# Patient Record
Sex: Male | Born: 1960 | Race: White | Hispanic: No | Marital: Single | Smoking: Current every day smoker
Health system: Southern US, Community
[De-identification: ages and names within clinical notes are randomized; demographics above are authoritative.]

## PROBLEM LIST (undated history)

## (undated) DIAGNOSIS — I10 Essential (primary) hypertension: Secondary | ICD-10-CM

## (undated) DIAGNOSIS — E785 Hyperlipidemia, unspecified: Secondary | ICD-10-CM

## (undated) DIAGNOSIS — M1990 Unspecified osteoarthritis, unspecified site: Secondary | ICD-10-CM

## (undated) DIAGNOSIS — I82409 Acute embolism and thrombosis of unspecified deep veins of unspecified lower extremity: Secondary | ICD-10-CM

## (undated) DIAGNOSIS — L409 Psoriasis, unspecified: Secondary | ICD-10-CM

## (undated) HISTORY — DX: Psoriasis, unspecified: L40.9

## (undated) HISTORY — DX: Essential (primary) hypertension: I10

## (undated) HISTORY — PX: TONSILLECTOMY: SUR1361

## (undated) HISTORY — DX: Acute embolism and thrombosis of unspecified deep veins of unspecified lower extremity: I82.409

## (undated) HISTORY — DX: Unspecified osteoarthritis, unspecified site: M19.90

## (undated) HISTORY — PX: OTHER SURGICAL HISTORY: SHX169

## (undated) HISTORY — DX: Hyperlipidemia, unspecified: E78.5

---

## 2004-07-01 ENCOUNTER — Ambulatory Visit: Payer: Self-pay | Admitting: Internal Medicine

## 2004-08-15 ENCOUNTER — Ambulatory Visit: Payer: Self-pay | Admitting: Internal Medicine

## 2004-08-16 ENCOUNTER — Ambulatory Visit: Payer: Self-pay | Admitting: Internal Medicine

## 2004-08-24 ENCOUNTER — Encounter: Admission: RE | Admit: 2004-08-24 | Discharge: 2004-08-24 | Payer: Self-pay | Admitting: Internal Medicine

## 2004-08-24 ENCOUNTER — Ambulatory Visit: Payer: Self-pay

## 2004-09-06 ENCOUNTER — Ambulatory Visit: Payer: Self-pay | Admitting: Internal Medicine

## 2004-12-26 ENCOUNTER — Ambulatory Visit: Payer: Self-pay | Admitting: Internal Medicine

## 2005-01-19 ENCOUNTER — Ambulatory Visit: Payer: Self-pay | Admitting: Internal Medicine

## 2005-04-04 ENCOUNTER — Ambulatory Visit: Payer: Self-pay | Admitting: Internal Medicine

## 2005-04-11 ENCOUNTER — Ambulatory Visit: Payer: Self-pay | Admitting: Internal Medicine

## 2005-05-29 ENCOUNTER — Ambulatory Visit: Payer: Self-pay | Admitting: Internal Medicine

## 2005-10-25 ENCOUNTER — Ambulatory Visit: Payer: Self-pay | Admitting: Internal Medicine

## 2005-10-30 ENCOUNTER — Ambulatory Visit: Payer: Self-pay | Admitting: Internal Medicine

## 2006-01-08 ENCOUNTER — Ambulatory Visit: Payer: Self-pay | Admitting: Internal Medicine

## 2006-08-01 ENCOUNTER — Ambulatory Visit: Payer: Self-pay | Admitting: Internal Medicine

## 2006-08-01 LAB — CONVERTED CEMR LAB
AST: 19 units/L (ref 0–37)
Creatinine, Ser: 0.7 mg/dL (ref 0.4–1.5)
Direct LDL: 107.2 mg/dL
HDL: 42.5 mg/dL (ref >39.0)
Hgb A1c MFr Bld: 6.6 % — ABNORMAL HIGH (ref 4.6–6.0)
Microalb, Ur: 4.9 mg/dL — ABNORMAL HIGH (ref 0.0–1.9)
Potassium: 4.2 meq/L (ref 3.5–5.1)

## 2006-08-09 ENCOUNTER — Ambulatory Visit: Payer: Self-pay | Admitting: Internal Medicine

## 2006-11-15 ENCOUNTER — Encounter: Payer: Self-pay | Admitting: Internal Medicine

## 2006-11-28 ENCOUNTER — Ambulatory Visit: Payer: Self-pay | Admitting: Internal Medicine

## 2006-11-28 DIAGNOSIS — T7841XA Arthus phenomenon, initial encounter: Secondary | ICD-10-CM

## 2006-11-29 LAB — CONVERTED CEMR LAB
ALT: 20 U/L
AST: 18 U/L
Cholesterol: 226 mg/dL
Creatinine,U: 153.9 mg/dL
Direct LDL: 115.4 mg/dL
HDL: 40 mg/dL
Hgb A1c MFr Bld: 6.6 % — ABNORMAL HIGH
Microalb Creat Ratio: 59.1 mg/g — ABNORMAL HIGH
Microalb, Ur: 9.1 mg/dL — ABNORMAL HIGH
Total CHOL/HDL Ratio: 5.7
Triglycerides: 410 mg/dL
VLDL: 82 mg/dL — ABNORMAL HIGH

## 2006-12-07 ENCOUNTER — Ambulatory Visit: Payer: Self-pay | Admitting: Internal Medicine

## 2006-12-07 DIAGNOSIS — E119 Type 2 diabetes mellitus without complications: Secondary | ICD-10-CM

## 2006-12-07 DIAGNOSIS — E785 Hyperlipidemia, unspecified: Secondary | ICD-10-CM | POA: Insufficient documentation

## 2006-12-07 DIAGNOSIS — L408 Other psoriasis: Secondary | ICD-10-CM

## 2006-12-07 DIAGNOSIS — I10 Essential (primary) hypertension: Secondary | ICD-10-CM | POA: Insufficient documentation

## 2007-01-04 ENCOUNTER — Encounter: Payer: Self-pay | Admitting: Internal Medicine

## 2007-01-18 ENCOUNTER — Encounter: Payer: Self-pay | Admitting: Internal Medicine

## 2007-02-26 ENCOUNTER — Ambulatory Visit: Payer: Self-pay | Admitting: Internal Medicine

## 2007-02-26 ENCOUNTER — Telehealth (INDEPENDENT_AMBULATORY_CARE_PROVIDER_SITE_OTHER): Payer: Self-pay | Admitting: *Deleted

## 2007-02-27 ENCOUNTER — Encounter (INDEPENDENT_AMBULATORY_CARE_PROVIDER_SITE_OTHER): Payer: Self-pay | Admitting: *Deleted

## 2007-03-01 ENCOUNTER — Encounter (INDEPENDENT_AMBULATORY_CARE_PROVIDER_SITE_OTHER): Payer: Self-pay | Admitting: *Deleted

## 2007-03-01 LAB — CONVERTED CEMR LAB
AST: 20 units/L (ref 0–37)
BUN: 14 mg/dL (ref 6–23)
Creatinine, Ser: 0.8 mg/dL (ref 0.4–1.5)
Triglycerides: 295 mg/dL (ref 0–149)

## 2007-08-08 ENCOUNTER — Telehealth (INDEPENDENT_AMBULATORY_CARE_PROVIDER_SITE_OTHER): Payer: Self-pay | Admitting: *Deleted

## 2007-08-09 ENCOUNTER — Telehealth (INDEPENDENT_AMBULATORY_CARE_PROVIDER_SITE_OTHER): Payer: Self-pay | Admitting: *Deleted

## 2007-08-12 ENCOUNTER — Encounter: Payer: Self-pay | Admitting: Internal Medicine

## 2007-08-12 ENCOUNTER — Encounter
Admission: RE | Admit: 2007-08-12 | Discharge: 2007-08-12 | Payer: Self-pay | Admitting: Physical Medicine and Rehabilitation

## 2007-08-19 ENCOUNTER — Encounter: Payer: Self-pay | Admitting: Internal Medicine

## 2007-08-29 ENCOUNTER — Ambulatory Visit (HOSPITAL_COMMUNITY): Admission: RE | Admit: 2007-08-29 | Discharge: 2007-08-31 | Payer: Self-pay | Admitting: Neurological Surgery

## 2007-08-30 ENCOUNTER — Encounter (INDEPENDENT_AMBULATORY_CARE_PROVIDER_SITE_OTHER): Payer: Self-pay | Admitting: Neurological Surgery

## 2007-08-30 ENCOUNTER — Ambulatory Visit: Payer: Self-pay | Admitting: Vascular Surgery

## 2007-09-05 ENCOUNTER — Ambulatory Visit: Payer: Self-pay | Admitting: Cardiology

## 2007-09-12 ENCOUNTER — Ambulatory Visit: Payer: Self-pay | Admitting: Internal Medicine

## 2007-09-12 DIAGNOSIS — Z86718 Personal history of other venous thrombosis and embolism: Secondary | ICD-10-CM | POA: Insufficient documentation

## 2007-09-17 ENCOUNTER — Ambulatory Visit: Payer: Self-pay | Admitting: Cardiology

## 2007-09-26 ENCOUNTER — Ambulatory Visit: Payer: Self-pay | Admitting: Internal Medicine

## 2007-10-10 ENCOUNTER — Ambulatory Visit: Payer: Self-pay | Admitting: Cardiology

## 2007-10-15 ENCOUNTER — Telehealth (INDEPENDENT_AMBULATORY_CARE_PROVIDER_SITE_OTHER): Payer: Self-pay | Admitting: *Deleted

## 2007-10-24 ENCOUNTER — Ambulatory Visit: Payer: Self-pay | Admitting: Internal Medicine

## 2007-11-11 ENCOUNTER — Ambulatory Visit: Payer: Self-pay | Admitting: Cardiology

## 2007-11-22 ENCOUNTER — Telehealth (INDEPENDENT_AMBULATORY_CARE_PROVIDER_SITE_OTHER): Payer: Self-pay | Admitting: *Deleted

## 2007-12-02 ENCOUNTER — Ambulatory Visit: Payer: Self-pay | Admitting: Cardiology

## 2008-01-03 ENCOUNTER — Ambulatory Visit: Payer: Self-pay | Admitting: Cardiology

## 2008-01-22 DIAGNOSIS — J45909 Unspecified asthma, uncomplicated: Secondary | ICD-10-CM | POA: Insufficient documentation

## 2008-01-24 ENCOUNTER — Ambulatory Visit: Payer: Self-pay | Admitting: Internal Medicine

## 2008-01-24 ENCOUNTER — Ambulatory Visit: Payer: Self-pay | Admitting: Cardiology

## 2008-01-31 ENCOUNTER — Ambulatory Visit: Payer: Self-pay | Admitting: Internal Medicine

## 2008-02-08 LAB — CONVERTED CEMR LAB
BUN: 12 mg/dL (ref 6–23)
Cholesterol: 195 mg/dL (ref 0–200)
Creatinine,U: 90.1 mg/dL
Direct LDL: 123.8 mg/dL
HDL: 39.6 mg/dL (ref >39.0)
Hgb A1c MFr Bld: 6.5 % — ABNORMAL HIGH (ref 4.6–6.0)
Microalb, Ur: 2.8 mg/dL — ABNORMAL HIGH (ref 0.0–1.9)
Total CHOL/HDL Ratio: 4.9
Triglycerides: 206 mg/dL (ref 0–149)
VLDL: 41 mg/dL — ABNORMAL HIGH (ref 0–40)

## 2008-02-10 ENCOUNTER — Encounter (INDEPENDENT_AMBULATORY_CARE_PROVIDER_SITE_OTHER): Payer: Self-pay | Admitting: *Deleted

## 2008-02-14 ENCOUNTER — Ambulatory Visit: Payer: Self-pay | Admitting: Internal Medicine

## 2008-02-25 ENCOUNTER — Telehealth (INDEPENDENT_AMBULATORY_CARE_PROVIDER_SITE_OTHER): Payer: Self-pay | Admitting: *Deleted

## 2008-03-13 ENCOUNTER — Ambulatory Visit: Payer: Self-pay | Admitting: Cardiology

## 2008-03-27 ENCOUNTER — Telehealth (INDEPENDENT_AMBULATORY_CARE_PROVIDER_SITE_OTHER): Payer: Self-pay | Admitting: *Deleted

## 2008-04-01 ENCOUNTER — Encounter (INDEPENDENT_AMBULATORY_CARE_PROVIDER_SITE_OTHER): Payer: Self-pay | Admitting: *Deleted

## 2008-04-10 ENCOUNTER — Ambulatory Visit: Payer: Self-pay | Admitting: Cardiology

## 2008-04-23 ENCOUNTER — Encounter: Admission: RE | Admit: 2008-04-23 | Discharge: 2008-04-23 | Payer: Self-pay | Admitting: Neurological Surgery

## 2008-04-23 ENCOUNTER — Ambulatory Visit: Payer: Self-pay

## 2008-04-23 ENCOUNTER — Encounter: Payer: Self-pay | Admitting: Internal Medicine

## 2008-04-28 ENCOUNTER — Telehealth (INDEPENDENT_AMBULATORY_CARE_PROVIDER_SITE_OTHER): Payer: Self-pay | Admitting: *Deleted

## 2008-05-11 ENCOUNTER — Telehealth (INDEPENDENT_AMBULATORY_CARE_PROVIDER_SITE_OTHER): Payer: Self-pay | Admitting: *Deleted

## 2008-08-24 ENCOUNTER — Telehealth (INDEPENDENT_AMBULATORY_CARE_PROVIDER_SITE_OTHER): Payer: Self-pay | Admitting: *Deleted

## 2008-11-17 ENCOUNTER — Telehealth (INDEPENDENT_AMBULATORY_CARE_PROVIDER_SITE_OTHER): Payer: Self-pay | Admitting: *Deleted

## 2008-11-20 ENCOUNTER — Ambulatory Visit: Payer: Self-pay | Admitting: Internal Medicine

## 2008-11-22 LAB — CONVERTED CEMR LAB
Albumin: 3.8 g/dL (ref 3.5–5.2)
Alkaline Phosphatase: 42 units/L (ref 39–117)
Bilirubin, Direct: 0 mg/dL (ref 0.0–0.3)
Cholesterol: 176 mg/dL (ref 0–200)
Direct LDL: 104.2 mg/dL
HDL: 41.6 mg/dL (ref >39.00)
Hgb A1c MFr Bld: 6.7 % — ABNORMAL HIGH (ref 4.6–6.5)
Total Bilirubin: 0.8 mg/dL (ref 0.3–1.2)
Total CHOL/HDL Ratio: 4
Total Protein: 6.6 g/dL (ref 6.0–8.3)

## 2008-11-24 ENCOUNTER — Encounter (INDEPENDENT_AMBULATORY_CARE_PROVIDER_SITE_OTHER): Payer: Self-pay | Admitting: *Deleted

## 2009-02-04 ENCOUNTER — Ambulatory Visit: Payer: Self-pay | Admitting: Internal Medicine

## 2009-02-18 ENCOUNTER — Telehealth (INDEPENDENT_AMBULATORY_CARE_PROVIDER_SITE_OTHER): Payer: Self-pay | Admitting: *Deleted

## 2009-02-18 ENCOUNTER — Ambulatory Visit: Payer: Self-pay | Admitting: Internal Medicine

## 2009-02-18 DIAGNOSIS — F39 Unspecified mood [affective] disorder: Secondary | ICD-10-CM

## 2009-03-11 ENCOUNTER — Encounter: Payer: Self-pay | Admitting: Internal Medicine

## 2009-03-12 ENCOUNTER — Telehealth (INDEPENDENT_AMBULATORY_CARE_PROVIDER_SITE_OTHER): Payer: Self-pay | Admitting: *Deleted

## 2009-04-06 ENCOUNTER — Telehealth (INDEPENDENT_AMBULATORY_CARE_PROVIDER_SITE_OTHER): Payer: Self-pay | Admitting: *Deleted

## 2009-05-14 ENCOUNTER — Telehealth (INDEPENDENT_AMBULATORY_CARE_PROVIDER_SITE_OTHER): Payer: Self-pay | Admitting: *Deleted

## 2009-05-18 ENCOUNTER — Ambulatory Visit: Payer: Self-pay | Admitting: Internal Medicine

## 2009-05-18 DIAGNOSIS — F411 Generalized anxiety disorder: Secondary | ICD-10-CM | POA: Insufficient documentation

## 2009-05-24 ENCOUNTER — Telehealth: Payer: Self-pay | Admitting: Internal Medicine

## 2009-05-26 ENCOUNTER — Telehealth (INDEPENDENT_AMBULATORY_CARE_PROVIDER_SITE_OTHER): Payer: Self-pay | Admitting: *Deleted

## 2009-06-04 ENCOUNTER — Telehealth (INDEPENDENT_AMBULATORY_CARE_PROVIDER_SITE_OTHER): Payer: Self-pay | Admitting: *Deleted

## 2009-06-09 ENCOUNTER — Telehealth (INDEPENDENT_AMBULATORY_CARE_PROVIDER_SITE_OTHER): Payer: Self-pay | Admitting: *Deleted

## 2009-09-03 ENCOUNTER — Ambulatory Visit: Payer: Self-pay | Admitting: Internal Medicine

## 2009-09-03 DIAGNOSIS — J069 Acute upper respiratory infection, unspecified: Secondary | ICD-10-CM | POA: Insufficient documentation

## 2010-03-03 ENCOUNTER — Telehealth (INDEPENDENT_AMBULATORY_CARE_PROVIDER_SITE_OTHER): Payer: Self-pay | Admitting: *Deleted

## 2010-03-10 ENCOUNTER — Telehealth (INDEPENDENT_AMBULATORY_CARE_PROVIDER_SITE_OTHER): Payer: Self-pay | Admitting: *Deleted

## 2010-03-11 ENCOUNTER — Telehealth (INDEPENDENT_AMBULATORY_CARE_PROVIDER_SITE_OTHER): Payer: Self-pay | Admitting: *Deleted

## 2010-03-17 ENCOUNTER — Telehealth (INDEPENDENT_AMBULATORY_CARE_PROVIDER_SITE_OTHER): Payer: Self-pay | Admitting: *Deleted

## 2010-05-04 ENCOUNTER — Telehealth (INDEPENDENT_AMBULATORY_CARE_PROVIDER_SITE_OTHER): Payer: Self-pay | Admitting: *Deleted

## 2010-06-06 ENCOUNTER — Encounter: Payer: Self-pay | Admitting: Internal Medicine

## 2010-06-07 ENCOUNTER — Ambulatory Visit: Payer: Self-pay | Admitting: Internal Medicine

## 2010-06-13 LAB — CONVERTED CEMR LAB
Alkaline Phosphatase: 58 units/L (ref 39–117)
BUN: 17 mg/dL (ref 6–23)
Bilirubin, Direct: 0.2 mg/dL (ref 0.0–0.3)
Chloride: 102 meq/L (ref 96–112)
Creatinine, Ser: 0.7 mg/dL (ref 0.4–1.5)
Direct LDL: 103 mg/dL
GFR calc non Af Amer: 136.32 mL/min (ref >60.00)
Glucose, Bld: 243 mg/dL — ABNORMAL HIGH (ref 70–99)
HDL: 50.1 mg/dL (ref >39.00)
Hgb A1c MFr Bld: 9.5 % — ABNORMAL HIGH (ref 4.6–6.5)
TSH: 1.22 microintl units/mL (ref 0.35–5.50)
Total Bilirubin: 0.5 mg/dL (ref 0.3–1.2)
VLDL: 63.4 mg/dL — ABNORMAL HIGH (ref 0.0–40.0)

## 2010-07-24 LAB — CONVERTED CEMR LAB
Cholesterol, target level: 200 mg/dL
LDL Goal: 100 mg/dL

## 2010-07-26 NOTE — Progress Notes (Signed)
Summary: [facility] needs strength of med  Phone Note Refill Request Message from:  Fax from Pharmacy on March 10, 2010 11:16 AM  Refills Requested: Medication #1:  ALBUTEROL   AERS 1 every 4 hours as needed [facility] - fax [phone] --- needs clairification - "please provide strength comes 0.21% - 0.42% - 0.083%"   Initial call taken by: Okey Regal Spring,  March 10, 2010 11:20 AM  Follow-up for Phone Call        2.5 /80mL ampule Follow-up by: Marga Melnick MD,  March 10, 2010 5:12 PM    New/Updated Medications: ALBUTEROL   AERS (ALBUTEROL AERS) 1  ampule ( 2.5 /58mL) every 4 hours as needed in nebulizer Prescriptions: ALBUTEROL   AERS (ALBUTEROL AERS) 1  ampule ( 2.5 /89mL) every 4 hours as needed in nebulizer  #3 month supp x 3   Entered by:   Doristine Devoid CMA   Authorized by:   Marga Melnick MD   Signed by:   Doristine Devoid CMA on 03/11/2010   Method used:   Faxed to ...       [facility] ([facility])             , [place]         Ph: [phone]       Fax: [phone]   RxID:   [id number]

## 2010-07-26 NOTE — Progress Notes (Signed)
Summary: Needs Meds  Phone Note Refill Request Call back at Home Phone [phone] Message from:  Patient on May 04, 2010 11:59 AM  Refills Requested: Medication #1:  METFORMIN HCL 1000 MG TABS two times a day**APPOINTMENT DUE**  Medication #2:  CRESTOR 20 MG TABS 1 by mouth once daily **APPOINTMENT DUE**.  Medication #3:  LISINOPRIL 40 MG TABS once daily  Medication #4:  GLIMEPIRIDE 4 MG TABS once daily Metformin 1000mg  Metoprolol Tartrate 50mg  Januvia 100mg  Actos 45mg  Cymbalta 60mg  Patient's is in [place] with his terminal father. He was not plannong on staying this long but needs to. He needs a 14 day vacation supply called to a local pharmacy for all these meds.  Pharmacy is [facility] [address] [place], [place] [address] Phone #: [phone]  Initial call taken by: Harold Barban,  May 04, 2010 12:03 PM  Follow-up for Phone Call        I called the pharmacy and asked if the charge will be the same for 30day as 14days and he responded probably.  I ok'd all meds for a 30day supply   Patient aware rx's called in  Follow-up by: Shonna Chock CMA,  May 04, 2010 1:29 PM

## 2010-07-26 NOTE — Progress Notes (Signed)
Summary: NEEDS INHALER FOR [facility]  Phone Note Call from Patient Call back at Home Phone [phone]   Caller: Patient Summary of Call: PATIENT CALLED--STATES THAT HE HAS RECEIVED INCORRECT PRODUCT FROM [facility]---SAYS HE HAS GOTTEN LITTLE CAPSULE-LIKE PRODUCTS THAT GO INTO A NEBULIZER  ***HE SAYS HE NEEDS A ALBUTEROL INHALER****   AND HE IS OUT  OF PRESENT MEDICATION  1) HE NEEDS A ONE MONTH PRESCRIPTION CALLED INTO [facility] [address], [place]--PLEASE CALL HIM TO CONFIRM THAT PRESCRIPTION HAS BEEN CALLED IN  2) HE NEEDS A PRESCRIPTION FOR ALBUTEROL INHALER CALLED INTO [facility]  3) SINCE [facility] WILL NOT TAKE BACK THIS MEDICATION AND IT IS NOT WHAT HE ASKED FOR, WHAT CAN HE DO WITH IT??  PLEASE CALL PATIENT BACK  Initial call taken by: Jerolyn Shin,  March 17, 2010 4:50 PM  Follow-up for Phone Call        Left message on Voicemail informing patient rx's sent in./[facility] CMA  March 18, 2010 1:29 PM     New/Updated Medications: VENTOLIN HFA 108 (90 BASE) MCG/ACT AERS (ALBUTEROL SULFATE) 1-2 inh every 4-6 hours as needed Prescriptions: VENTOLIN HFA 108 (90 BASE) MCG/ACT AERS (ALBUTEROL SULFATE) 1-2 inh every 4-6 hours as needed  #64mo supply x 3   Entered by:   Shonna Chock CMA   Authorized by:   Marga Melnick MD   Signed by:   Shonna Chock CMA on 03/18/2010   Method used:   Faxed to ...       [facility] ([facility]             , [place]         Ph: [phone]       Fax: [phone]   RxID:   [id number] VENTOLIN HFA 108 (90 BASE) MCG/ACT AERS (ALBUTEROL SULFATE) 1-2 inh every 4-6 hours as needed  #17mo supply x 0   Entered by:   Shonna Chock CMA   Authorized by:   Marga Melnick MD   Signed by:   Shonna Chock CMA on 03/18/2010   Method used:   Electronically to        [id number] (retail)       [address] [place], [place]  [address]       Ph: [phone]       Fax: [phone]   RxID:    [id number]

## 2010-07-26 NOTE — Progress Notes (Signed)
Summary: refil not received   Phone Note Refill Request Message from:  Patient  Refills Requested: Medication #1:  CRESTOR 20 MG TABS 1 by mouth once daily **APPOINTMENT DUE**.  Medication #2:  ACCU CHECK COMPAC STRIPS 6X17 check one time daily  Medication #3:  ACTOS 45 MG TABS 1 by mouth once daily**APPOINTMENT DUE**  Medication #4:  JANUVIA 100 MG  TABS 1 once daily**APPOINTMENT DUE**  Glimepiride and Metformin also [facility] didnt receive refill [id number]  Initial call taken by: Okey Regal Spring,  March 11, 2010 4:33 PM    Prescriptions: GLIMEPIRIDE 4 MG TABS (GLIMEPIRIDE) once daily  #90 x 0   Entered by:   Shonna Chock CMA   Authorized by:   Marga Melnick MD   Signed by:   Shonna Chock CMA on 03/11/2010   Method used:   Faxed to ...       [facility]             , [place]         Ph: [phone]       Fax: [phone]   RxID:   [id number] METFORMIN HCL 1000 MG TABS (METFORMIN HCL) two times a day**APPOINTMENT DUE**  #180 x 0   Entered by:   Shonna Chock CMA   Authorized by:   Marga Melnick MD   Signed by:   Shonna Chock CMA on 03/11/2010   Method used:   Faxed to ...       [facility]             , [place]         Ph: [phone]       Fax: [phone]   RxID:   [id number] CRESTOR 20 MG TABS (ROSUVASTATIN CALCIUM) 1 by mouth once daily **APPOINTMENT DUE**  #90 x 0   Entered by:   Shonna Chock CMA   Authorized by:   Marga Melnick MD   Signed by:   Shonna Chock CMA on 03/11/2010   Method used:   Faxed to ...       [facility]             , [place]         Ph: [phone]       Fax: [phone]   RxID:   [id number] JANUVIA 100 MG  TABS (SITAGLIPTIN PHOSPHATE) 1 once daily**APPOINTMENT DUE**  #90 x 0   Entered by:   Shonna Chock CMA   Authorized by:   Marga Melnick MD   Signed by:   Shonna Chock CMA on 03/11/2010   Method used:   Faxed to ...       [facility]             , [place]         Ph: [phone]       Fax:  [phone]   RxID:   [id number] ACTOS 45 MG TABS (PIOGLITAZONE HCL) 1 by mouth once daily**APPOINTMENT DUE**  #90 x 0   Entered by:   Shonna Chock CMA   Authorized by:   Marga Melnick MD   Signed by:   Shonna Chock CMA on 03/11/2010   Method used:   Faxed to ...       [facility]             , [place]         Ph: [phone]       Fax: [phone]   RxID:   [id number] ACCU  CHECK COMPAC STRIPS 6X17 check one time daily  #100 x 1   Entered by:   Shonna Chock CMA   Authorized by:   Marga Melnick MD   Signed by:   Shonna Chock CMA on 03/11/2010   Method used:   Faxed to ...       [facility]             , [place]         Ph: [phone]       Fax: [phone]   RxID:   [id number] CYMBALTA 60 MG CPEP (DULOXETINE HCL) 1 once daily Brand medically necessary #90 Capsule x 0   Entered by:   Shonna Chock CMA   Authorized by:   Marga Melnick MD   Signed by:   Shonna Chock CMA on 03/11/2010   Method used:   Faxed to ...       [facility]             , [place]         Ph: [phone]       Fax: [phone]   RxID:   [id number]

## 2010-07-26 NOTE — Assessment & Plan Note (Signed)
Summary: discuss meds//lch   Vital Signs:  Patient profile:   50 year old male Weight:      315.2 pounds Pulse rate:   72 / minute Resp:     16 per minute BP sitting:   132 / 96  (left arm) Cuff size:   large  Vitals Entered By: Shonna Chock (September 03, 2009 8:10 AM) CC: Discuss changing med due to insurance changes Comments REVIEWED MED LIST, PATIENT AGREED DOSE AND INSTRUCTION CORRECT    CC:  Discuss changing med due to insurance changes.  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 Hegot  RTI traveling with head congestion as of 08/30/2009; Rx: Nyquil & Dayquil with response. FBS 112-115 while on trip; high FBS 120; 2 hr post meal glucose < 150. No hypoglycemia; weight up 5 #. CVE on weekends as hiking w/o symptoms. [facility] options reviewed.  Allergies: 1)  ! Felodipine  Review of Systems General:  Denies chills, fatigue, fever, and sleep disorder. Eyes:  Denies blurring, double vision, and vision loss-both eyes; Last exam 05/2009 ; no retinopathy. ENT:  Complains of nasal congestion and sinus pressure; No frontal headache, facial pain or purulence. CV:  Denies chest pain or discomfort, leg cramps with exertion, lightheadness, near fainting, shortness of breath with exertion, swelling of feet, and swelling of hands. Resp:  Denies cough and sputum productive. Derm:  Denies poor wound healing. Neuro:  Denies numbness and tingling. Psych:  Denies anxiety, depression, easily angered, easily tearful, and irritability. Endo:  Denies excessive hunger, excessive thirst, and excessive urination.  Physical Exam  General:  in no acute distress; alert,appropriate and cooperative throughout examination Ears:  External ear exam shows no significant lesions or deformities.  Otoscopic examination reveals clear canals, tympanic membranes  are intact bilaterally without bulging, retraction, inflammation or discharge. Hearing is grossly normal bilaterally. R TM scarred Nose:  External nasal examination shows no deformity or inflammation. Nasal mucosa are pink and moist without lesions or exudates. Mouth:  Oral mucosa and oropharynx without lesions or exudates.  Mild pharyngeal erythema.   Lungs:  Normal respiratory effort, chest expands symmetrically. Lungs are clear to auscultation, no crackles or wheezes.Decreased BS Heart:  Normal rate and regular rhythm. S1 and S2 normal without gallop, murmur, click, rub. Distant heart sounds Pulses:  R and L carotid,radial,dorsalis pedis and posterior tibial pulses are full and equal bilaterally Extremities:  No clubbing, cyanosis, edema. Pes planus. Fungal nail changes Neurologic:  alert & oriented X3 and sensation intact to light touch over feet.   Skin:  Intact without suspicious lesions or rashes Cervical Nodes:  No lymphadenopathy noted Axillary Nodes:  No palpable lymphadenopathy Psych:  memory intact for recent and remote, normally interactive, good eye contact, not anxious appearing, and not depressed appearing.     Impression & Recommendations:  Problem # 1:  URI (ICD-465.9)  Problem # 2:  HYPERTENSION, ESSENTIAL NOS (ICD-401.9) on Nyquil His updated medication list for this problem includes:    Lisinopril 40 Mg Tabs (Lisinopril) ..... Once daily    Metoprolol Tartrate 50 Mg Tabs (Metoprolol tartrate) .Marland Kitchen... 1/2 tab two times a day*  Problem # 3:  HYPERLIPIDEMIA (ICD-272.4)  The following medications were removed from the medication list:    Lovaza 1 Gm Caps (Omega-3-acid ethyl esters) .Marland Kitchen... 2 two times a day*  Crestor 20 Mg Tabs (Rosuvastatin calcium) .Marland Kitchen... 1 once daily** His updated medication list for this problem includes:    Vytorin 10-20 Mg Tabs (Ezetimibe-simvastatin) .Marland Kitchen... 1  at bedtime  Problem # 4:  DIABETES-TYPE 2 (ICD-250.00)  His updated medication list for  this problem includes:    Lisinopril 40 Mg Tabs (Lisinopril) ..... Once daily    Metformin Hcl 1000 Mg Tabs (Metformin hcl) .Marland Kitchen..Marland Kitchen Two times a day*    Glimepiride 4 Mg Tabs (Glimepiride) ..... Once daily    Actos 45 Mg Tabs (Pioglitazone hcl) .Marland Kitchen... 1 by mouth once daily**    Januvia 100 Mg Tabs (Sitagliptin phosphate) .Marland Kitchen... 1 once daily*  Complete Medication List: 1)  Lisinopril 40 Mg Tabs (Lisinopril) .... Once daily 2)  Metformin Hcl 1000 Mg Tabs (Metformin hcl) .... Two times a day* 3)  Wellbutrin Sr 150 Mg Tb12 (Bupropion hcl) .... Two times a day 4)  Enbrel 50 Mg/ml Soln (Etanercept) .... Q week 5)  Glimepiride 4 Mg Tabs (Glimepiride) .... Once daily 6)  Actos 45 Mg Tabs (Pioglitazone hcl) .Marland Kitchen.. 1 by mouth once daily** 7)  Metoprolol Tartrate 50 Mg Tabs (Metoprolol tartrate) .... 1/2 tab two times a day* 8)  Albuterol Aers (Albuterol aers) .Marland Kitchen.. 1 every 4 hours as needed 9)  Januvia 100 Mg Tabs (Sitagliptin phosphate) .Marland Kitchen.. 1 once daily* 10)  Accu Check Compack Plus Kit  .... Checks bs two times a day dx 250.00 11)  Accu Check Compac Strips 6x17  .... Check one time daily 12)  Accu Check Solution  13)  Soft Click Lancets  14)  Oxycodone-acetaminophen 7.5-500 Mg Tabs (Oxycodone-acetaminophen) .... Qid (per dr. Murray Hodgkins) 15)  Cymbalta 60 Mg Cpep (Duloxetine hcl) .Marland Kitchen.. 1 once daily 16)  Vytorin 10-20 Mg Tabs (Ezetimibe-simvastatin) .Marland Kitchen.. 1  at bedtime  Patient Instructions: 1)  Neti pot once daily until sinuses are clear.Vitamin C 2000 mg daily & Zicam until head clear. 2)  Drink as much fluid as you can tolerate for the next few days.Wellbutrin 1 every other day X 1 week then stop it. Omega 3 FA 2 grams / day. 3)  Please schedule a follow-up appointment in 3 months. 4)  BMP prior to visit, ICD-9: 5)  Hepatic Panel prior to visit, ICD-9: 6)  Lipid Panel prior to visit, ICD-9: 7)  TSH prior to visit, ICD-9: 8)  HbgA1C prior to visit, ICD-9: 9)  Urine Microalbumin prior to visit,  ICD-9: Prescriptions: CYMBALTA 60 MG CPEP (DULOXETINE HCL) 1 once daily  #90 x 1   Entered and Authorized by:   Marga Melnick MD   Signed by:   Marga Melnick MD on 09/03/2009   Method used:   Print then Give to Patient   RxID:   [id number] JANUVIA 100 MG  TABS (SITAGLIPTIN PHOSPHATE) 1 once daily*  #90 x 1   Entered and Authorized by:   Marga Melnick MD   Signed by:   Marga Melnick MD on 09/03/2009   Method used:   Print then Give to Patient   RxID:   [id number] METOPROLOL TARTRATE 50 MG  TABS (METOPROLOL TARTRATE) 1/2 tab two times a day*  #90 x 1   Entered and Authorized by:   Marga Melnick MD   Signed by:   Marga Melnick MD on 09/03/2009   Method used:   Print then Give to Patient   RxID:   [id number] ACTOS 45 MG TABS (PIOGLITAZONE HCL) 1 by mouth once daily**  #90 x 1   Entered  and Authorized by:   Marga Melnick MD   Signed by:   Marga Melnick MD on 09/03/2009   Method used:   Print then Give to Patient   RxID:   [id number] GLIMEPIRIDE 4 MG TABS (GLIMEPIRIDE) once daily  #90 x 1   Entered and Authorized by:   Marga Melnick MD   Signed by:   Marga Melnick MD on 09/03/2009   Method used:   Print then Give to Patient   RxID:   [id number] METFORMIN HCL 1000 MG TABS (METFORMIN HCL) two times a day*  #180 x 1   Entered and Authorized by:   Marga Melnick MD   Signed by:   Marga Melnick MD on 09/03/2009   Method used:   Print then Give to Patient   RxID:   [id number] LISINOPRIL 40 MG TABS (LISINOPRIL) once daily  #90 x 1   Entered and Authorized by:   Marga Melnick MD   Signed by:   Marga Melnick MD on 09/03/2009   Method used:   Print then Give to Patient   RxID:   [id number] VYTORIN 10-20 MG TABS (EZETIMIBE-SIMVASTATIN) 1  at bedtime  #90 x 0   Entered and Authorized by:   Marga Melnick MD   Signed by:   Marga Melnick MD on 09/03/2009   Method used:   Print then Give to Patient   RxID:   [id number]

## 2010-07-26 NOTE — Progress Notes (Signed)
Summary: RX Concerns  Phone Note Refill Request   Refills Requested: Medication #1:  METOPROLOL TARTRATE 50 MG  TABS 1/2 tab two times a day* Flag came through, error-metoprolol rx did not go through. I will resend to [facility]     Prescriptions: METOPROLOL TARTRATE 50 MG  TABS (METOPROLOL TARTRATE) 1/2 tab two times a day*  #90 Tablet x 0   Entered by:   Shonna Chock CMA   Authorized by:   Marga Melnick MD   Signed by:   Shonna Chock CMA on 03/03/2010   Method used:   Printed then faxed to ...       [facility]             , [place]         Ph: [phone]       Fax: [phone]   RxID:   [id number]

## 2010-07-28 NOTE — Assessment & Plan Note (Signed)
Summary: followup for med check///sph   Vital Signs:  Patient profile:   50 year old male Weight:      321.2 pounds BMI:     43.72 Temp:     98.7 degrees F oral Pulse rate:   76 / minute Resp:     16 per minute BP sitting:   130 / 80  (left arm) Cuff size:   large  Vitals Entered By: Shonna Chock CMA (June 07, 2010 9:11 AM) CC: Follow-up visit: refill meds , Type 2 diabetes mellitus follow-up   CC:  Follow-up visit: refill meds  and Type 2 diabetes mellitus follow-up.  History of Present Illness: Hyperlipidemia Follow-Up:      This is a 50 year old man who presents for Hyperlipidemia follow-up & med refill.  The patient reports constipation ( from narcotic for chronic back pain), but denies muscle aches, GI upset, abdominal pain, flushing, itching, diarrhea, and fatigue.  The patient denies the following symptoms: chest pain/pressure, exercise intolerance, dypsnea, palpitations, syncope, and pedal edema.  Compliance with medications (by patient report) has been near 100%.  Dietary compliance has been poor due to his involvement in his  father's terminal  care .  The patient reports exercising daily, walking his dog.  Adjunctive measures currently used by the patient include fiber, ASA, and fish oil supplements.   Hypertension Follow-Up:      The patient also presents for Hypertension follow-up.  The patient denies lightheadedness, urinary frequency, and headaches.  Compliance with medications (by patient report) has been near 100%.  Adjunctive measures currently used by the patient include salt restriction.  BP not checked @ home. Type 2 Diabetes Mellitus Follow-Up:      The patient is also here for Type 2 diabetes mellitus follow-up.  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  The patient denies the following symptoms:  neuropathic pain, vomiting, orthostatic symptoms, poor wound healing, intermittent claudication, vision loss, and foot ulcer.  The patient has been measuring capillary blood glucose before breakfast  ( 98-110)and at bedtime ( <140).  Since the last visit, the patient reports having had no eye care  but  foot care by a podiatrist (inserts Rxed).    Current Medications (verified): 1)  Lisinopril 40 Mg Tabs (Lisinopril) .... Once Daily 2)  Metformin Hcl 1000 Mg Tabs (Metformin Hcl) .... Two Times A Day**appointment Due** 3)  Enbrel 50 Mg/ml Soln (Etanercept) .... Q Week 4)  Glimepiride 4 Mg Tabs (Glimepiride) .... Once Daily 5)  Actos 45 Mg Tabs (Pioglitazone Hcl) .Marland Kitchen.. 1 By Mouth Once Daily**appointment Due** 6)  Metoprolol Tartrate 50 Mg  Tabs (Metoprolol Tartrate) .... 1/2 Tab Two Times A Day* 7)  Ventolin Hfa 108 (90 Base) Mcg/act Aers (Albuterol Sulfate) .Marland Kitchen.. 1-2 Inh Every 4-6 Hours As Needed 8)  Januvia 100 Mg  Tabs (Sitagliptin Phosphate) .Marland Kitchen.. 1 Once Daily**appointment Due** 9)  Accu Check Compack Plus Kit .... Checks Bs Two Times A Day Dx 250.00 10)  Accu Check Compac Strips 6x17 .... Check One Time Daily 11)  Accu Check Solution 12)  Soft Click Lancets 13)  Oxycodone-Acetaminophen 7.5-500 Mg Tabs (Oxycodone-Acetaminophen) .... Qid (Per Dr. Murray Hodgkins) 14)  Cymbalta 60 Mg Cpep (Duloxetine Hcl) .Marland Kitchen.. 1 Once Daily 15)  Crestor 20 Mg Tabs (Rosuvastatin Calcium) .Marland Kitchen.. 1 By Mouth Once Daily **appointment Due** 16)  Aspirin 325 Mg Tabs (  Aspirin) .Marland Kitchen.. 1 By Mouth Once Daily  Allergies: 1)  ! Felodipine  Family History: Father: MI in 64s, died pancreatic cancer 2010-06-02 Mother: MI  in 50s;S/P 5  stents , on coumadin Siblings:bro :AV replacement for congenital defect  Social History: on low carb diet Current Smoker:  quit 11/2009 Alcohol use-yes: 1xmonth Drug use-no  Physical Exam  General:  in no acute distress; alert,appropriate and cooperative throughout examination Neck:  No  deformities, masses, or tenderness noted. Lungs:  Normal respiratory effort, chest expands symmetrically. Lungs are clear to auscultation, no crackles or wheezes. BS decreased Heart:  Normal rate and regular rhythm. S1 and S2 normal without gallop, murmur, click, rub . Abdomen:  Bowel sounds positive,abdomen soft and non-tender without masses, organomegaly or hernias noted. Protuberant Pulses:  R and L carotid,radial  pulses are full and equal bilaterally. Decreased pedal pulses Extremities:  No clubbing, cyanosis, edema. Pes planus.Chronic fungal toenail changes .Marked drying of soles Neurologic:  alert & oriented X3, sensation  to light touc decreased L sole,  DTRs symmetrical and normal.   Skin:  Intact without suspicious lesions. Minor Psoriatic changes L forearm.Diaphoretic ( "always occurs  after hot  shower") Psych:  memory intact for recent and remote, normally interactive, and good eye contact.     Impression & Recommendations:  Problem # 1:  DIABETES-TYPE 2 (ICD-250.00)  ? control @  present due to diet changes in past 6 months His updated medication list for this problem includes:    Lisinopril 40 Mg Tabs (Lisinopril) ..... Once daily    Metformin Hcl 1000 Mg Tabs (Metformin hcl) .Marland Kitchen..Marland Kitchen Two times a day    Glimepiride 4 Mg Tabs (Glimepiride) ..... Once daily    Actos 45 Mg Tabs (Pioglitazone hcl) .Marland Kitchen... 1 by mouth once daily    Januvia 100 Mg Tabs (Sitagliptin phosphate) .Marland Kitchen... 1 once daily    Aspirin 325 Mg Tabs (Aspirin) .Marland Kitchen... 1 by mouth once daily  Orders: Venipuncture (82956) TLB-A1C / Hgb A1C (Glycohemoglobin) (83036-A1C) TLB-Microalbumin/Creat Ratio, Urine (82043-MALB)  Problem # 2:  HYPERLIPIDEMIA (ICD-272.4)  ? status ( see #1) His updated medication list for this problem includes:    Crestor 20 Mg Tabs (Rosuvastatin calcium) .Marland Kitchen... 1 by mouth once daily  Orders: Venipuncture (21308) TLB-Lipid Panel (80061-LIPID) TLB-Hepatic/Liver Function Pnl  (80076-HEPATIC) TLB-TSH (Thyroid Stimulating Hormone) (84443-TSH)  Problem # 3:  HYPERTENSION, ESSENTIAL NOS (ICD-401.9)  controlled His updated medication list for this problem includes:    Lisinopril 40 Mg Tabs (Lisinopril) ..... Once daily    Metoprolol Tartrate 50 Mg Tabs (Metoprolol tartrate) .Marland Kitchen... 1/2 tab two times a day*  Orders: Venipuncture (65784) TLB-BMP (Basic Metabolic Panel-BMET) (80048-METABOL)  Complete Medication List: 1)  Lisinopril 40 Mg Tabs (Lisinopril) .... Once daily 2)  Metformin Hcl 1000 Mg Tabs (Metformin hcl) .... Two times a day 3)  Enbrel 50 Mg/ml Soln (Etanercept) .... Q week 4)  Glimepiride 4 Mg Tabs (Glimepiride) .... Once daily 5)  Actos 45 Mg Tabs (Pioglitazone hcl) .Marland Kitchen.. 1 by mouth once daily 6)  Metoprolol Tartrate 50 Mg Tabs (Metoprolol tartrate) .... 1/2 tab two times a day* 7)  Ventolin Hfa 108 (90 Base) Mcg/act Aers (Albuterol sulfate) .Marland Kitchen.. 1-2 inh every 4-6 hours as needed 8)  Januvia 100 Mg Tabs (Sitagliptin phosphate) .Marland Kitchen.. 1 once daily 9)  Accu Check Compack Plus Kit  .... Checks bs two times a day dx 250.00 10)  Accu Check Compac Strips 6x17  .... Check one time daily 11)  Accu Check Solution  12)  Soft Click Lancets  13)  Oxycodone-acetaminophen 7.5-500 Mg Tabs (Oxycodone-acetaminophen) .... Qid (per dr. Murray Hodgkins) 14)  Cymbalta 60 Mg Cpep (Duloxetine hcl) .Marland Kitchen.. 1 once daily 15)  Crestor 20 Mg Tabs (Rosuvastatin calcium) .Marland Kitchen.. 1 by mouth once daily 16)  Aspirin 325 Mg Tabs (Aspirin) .Marland Kitchen.. 1 by mouth once daily  Patient Instructions: 1)  It is important that you exercise regularly at least 20 minutes 5 times a week. If you develop chest pain, have severe difficulty breathing, or feel very tired , stop exercising immediately and seek medical attention. 2)  Take an Aspirin every day. 3)  Check your blood sugars regularly. If your readings are usually above : 150 or below 90 you should contact our office. 4)  See your eye doctor yearly to check  for diabetic eye damage. 5)  Check your feet each night for sore areas, calluses or signs of infection. Prescriptions: VENTOLIN HFA 108 (90 BASE) MCG/ACT AERS (ALBUTEROL SULFATE) 1-2 inh every 4-6 hours as needed  #3 x 1   Entered and Authorized by:   Marga Melnick MD   Signed by:   Marga Melnick MD on 06/07/2010   Method used:   Print then Give to Patient   RxID:   [id number] CRESTOR 20 MG TABS (ROSUVASTATIN CALCIUM) 1 by mouth once daily  #90 x 1   Entered and Authorized by:   Marga Melnick MD   Signed by:   Marga Melnick MD on 06/07/2010   Method used:   Print then Give to Patient   RxID:   [id number] CYMBALTA 60 MG CPEP (DULOXETINE HCL) 1 once daily Brand medically necessary #90 Capsule x 1   Entered and Authorized by:   Marga Melnick MD   Signed by:   Marga Melnick MD on 06/07/2010   Method used:   Print then Give to Patient   RxID:   [id number] METOPROLOL TARTRATE 50 MG  TABS (METOPROLOL TARTRATE) 1/2 tab two times a day*  #90 Tablet x 1   Entered and Authorized by:   Marga Melnick MD   Signed by:   Marga Melnick MD on 06/07/2010   Method used:   Print then Give to Patient   RxID:   [id number] GLIMEPIRIDE 4 MG TABS (GLIMEPIRIDE) once daily  #90 x 1   Entered and Authorized by:   Marga Melnick MD   Signed by:   Marga Melnick MD on 06/07/2010   Method used:   Print then Give to Patient   RxID:   [id number] LISINOPRIL 40 MG TABS (LISINOPRIL) once daily  #90 Tablet x 1   Entered and Authorized by:   Marga Melnick MD   Signed by:   Marga Melnick MD on 06/07/2010   Method used:   Print then Give to Patient   RxID:   [id number] ACTOS 45 MG TABS (PIOGLITAZONE HCL) 1 by mouth once daily  #90 x 1   Entered and Authorized by:   Marga Melnick MD   Signed by:   Marga Melnick MD on 06/07/2010   Method used:   Print then Give to Patient   RxID:   [id number] JANUVIA 100 MG  TABS (SITAGLIPTIN PHOSPHATE) 1 once daily  #90 x 1    Entered and Authorized by:   Marga Melnick MD   Signed by:   Marga Melnick MD on 06/07/2010   Method used:   Print then Give to Patient   RxID:   [id number] METFORMIN HCL 1000  MG TABS (METFORMIN HCL) two times a day  #180 x 1   Entered and Authorized by:   Marga Melnick MD   Signed by:   Marga Melnick MD on 06/07/2010   Method used:   Print then Give to Patient   RxID:   [id number]    Orders Added: 1)  Est. Patient Level IV [14782] 2)  Venipuncture [95621] 3)  TLB-Lipid Panel [80061-LIPID] 4)  TLB-BMP (Basic Metabolic Panel-BMET) [80048-METABOL] 5)  TLB-Hepatic/Liver Function Pnl [80076-HEPATIC] 6)  TLB-TSH (Thyroid Stimulating Hormone) [84443-TSH] 7)  TLB-A1C / Hgb A1C (Glycohemoglobin) [83036-A1C] 8)  TLB-Microalbumin/Creat Ratio, Urine [82043-MALB]  Appended Document: followup for med check///sph

## 2010-07-28 NOTE — Letter (Signed)
Summary: [facility]  [facility]   Imported By: Lanelle Bal 06/22/2010 11:41:06  _____________________________________________________________________  External Attachment:    Type:   Image     Comment:   External Document

## 2010-11-08 NOTE — Op Note (Signed)
NAME:  Anthony Howell, Anthony Howell NO.:  [id number]   MEDICAL RECORD NO.:  [id number]          PATIENT TYPE:  OIB   LOCATION:  3599                         FACILITY:  [facility]   PHYSICIAN:  Tia Alert, MD     DATE OF BIRTH:  September 09, 1960   DATE OF PROCEDURE:  08/29/2007  DATE OF DISCHARGE:                               OPERATIVE REPORT   PREOPERATIVE DIAGNOSIS:  Lumbar disk herniation, L5-S1, on the left with  left S1 radiculopathy.   POSTOPERATIVE DIAGNOSIS:  Lumbar disk herniation, L5-S1, on the left  with left S1 radiculopathy.   PROCEDURE:  Lumbar hemilaminectomy, medial facetectomy and foraminotomy  at L5-S1 on the left followed by microdiskectomy of L5-S1 on the left  utilizing microscopic dissection.   SURGEON:  Marikay Alar, MD   ASSISTANT:  Aliene Beams, MD   ANESTHESIA:  General endotracheal.   COMPLICATIONS:  None apparent.   INDICATION FOR PROCEDURE:  Anthony Howell is a 50 year old gentleman who  presented with severe left leg pain in an S1 distribution with weakness  in his calf and with numbness in his foot.  He had an MRI which showed a  very large disk herniation with a large free fragment at L5-S1 on the  left with severe compression of the thecal sac and the S1 nerve root.  I  recommended a microdiskectomy of L5-S1 on the left side.  He understood  the risks, benefits and expected outcome and wished to proceed.   DESCRIPTION OF PROCEDURE:  The patient was taken to the operating room  and after induction of adequate generalized endotracheal anesthesia, he  was rolled into the prone position on the Wilson frame and all pressure  points were padded.  His lumbar region was prepped with DuraPrep and  then draped in the usual sterile fashion.  Five milliliters of local  anesthesia were injected.  A dorsal midline incision was made and  carried down to the lumbosacral fascia.  The fascia was opened and the  paraspinous musculature was taken down in  subperiosteal fashion to  expose L5-S1 on the left side.  Intraoperative x-ray confirmed my level  and then I used the high-speed drill and the Kerrison punches to perform  a hemilaminectomy, medial facetectomy and foraminotomy at L5-S1 on the  left side.  I removed the yellow ligament to expose the underlying dura  and S1 nerve root.  We retracted this medially and found a large  herniation which was subannular, but also a large free fragment; most of  this was in the axilla of the nerve root.  We dissected in the axilla  and remove several fragments of disk herniation.  We performed a  thorough intradiskal diskectomy.  We kept working in the axilla and in  the shoulder of the nerve root until we felt like we had a free nerve  root and had removed as much of the disk herniation as we possibly could  through this approach.  We palpated with a coronary dilator into the  foramen and into the midline to assure adequate decompression.  We felt  no more free fragments and saw no more fragment, looking both in the  axilla and the shoulder of the nerve root.  Therefore, we irrigated with  saline solution containing bacitracin, dried all bleeding points, lined  the dura with Gelfoam, removed our retractor and then closed the fascia  with 0 Vicryl, closed the subcutaneous and subcuticular tissue with 2-0  and 3-0 Vicryl and closed the skin with Benzoin and Steri-Strips.  The  drapes were removed.  A sterile dressing was applied.  The patient  awakened from general anesthesia and transferred to the recovery room in  stable condition.  At the end of the procedure all sponge, needle and  instrument counts were correct.      Tia Alert, MD  Electronically Signed     DSJ/MEDQ  D:  08/29/2007  T:  08/30/2007  Job:  [id number]

## 2011-01-03 ENCOUNTER — Other Ambulatory Visit: Payer: Self-pay | Admitting: Internal Medicine

## 2011-01-03 NOTE — Telephone Encounter (Signed)
Recheck fasting labs in 8 weeks ( Lipids, hepatic panel, BUN,creat,K+, A1c, urine microalbumin; 250.02, 277.7,995.20).  Copied from 05/2010 labs , labs were due 07/2010

## 2011-03-20 LAB — DIFFERENTIAL
Basophils Absolute: 0
Neutro Abs: 5.4
Neutrophils Relative %: 55

## 2011-03-20 LAB — APTT: aPTT: 27

## 2011-03-20 LAB — PROTIME-INR
INR: 0.8
Prothrombin Time: 11.7
Prothrombin Time: 13.6

## 2011-03-20 LAB — BASIC METABOLIC PANEL
BUN: 11
CO2: 26
Calcium: 9.4
Glucose, Bld: 111 — ABNORMAL HIGH
Sodium: 136

## 2011-03-20 LAB — CBC
HCT: 43.1
MCV: 94.7
Platelets: 222
RBC: 4.55
RDW: 14.2

## 2011-04-17 ENCOUNTER — Other Ambulatory Visit: Payer: Self-pay | Admitting: Internal Medicine

## 2011-04-20 ENCOUNTER — Other Ambulatory Visit: Payer: Self-pay

## 2011-04-20 MED ORDER — ACTOS 45 MG PO TABS: ORAL_TABLET | ORAL | Status: DC

## 2011-04-20 NOTE — Telephone Encounter (Signed)
Spoke with patient and informed him he is overdue to have A1c checked. Patient states he is aware and had to take a leave of absence for 6 months to go to [place] to take care of his mother, patient will be back the first of the year and will schedule lab appointment then.   Patient aware I will inform Dr.Hopper of his situation

## 2011-04-21 NOTE — Telephone Encounter (Signed)
His diabetes has been uncontrolled and he has increased risk of heart attack or stroke. He needs to be seen by a doctor in [place]. Certainly his mother's physician could  see him. Medicines will not be prescribed for longer than 30 days as risks must be addressed.

## 2011-04-21 NOTE — Telephone Encounter (Signed)
I spoke with patient and he indicated he will be charged the same for 30 as 90, patient states he had his lap top with him and he will see who is in his network and see if he can see his mother's doctor in [place]. Patient was informed about the risk he is taking by not having his DM followed up regularly (every 4-6 months), increase risk for heart attack and stroke. Patient aware we can fax any necessary documents if needed.    Dr.Hopper was informed and agreed to #90 and stated if he does not have f/u by a doctor in [place] he will no longer get refills and potentially have to transfer care.

## 2011-06-07 ENCOUNTER — Other Ambulatory Visit: Payer: Self-pay | Admitting: Internal Medicine

## 2011-06-07 DIAGNOSIS — E8881 Metabolic syndrome: Secondary | ICD-10-CM

## 2011-06-07 DIAGNOSIS — T887XXA Unspecified adverse effect of drug or medicament, initial encounter: Secondary | ICD-10-CM

## 2011-06-08 ENCOUNTER — Other Ambulatory Visit: Payer: Self-pay

## 2011-06-14 ENCOUNTER — Encounter: Payer: Self-pay | Admitting: Internal Medicine

## 2011-06-14 ENCOUNTER — Other Ambulatory Visit: Payer: Self-pay | Admitting: Internal Medicine

## 2011-06-14 ENCOUNTER — Other Ambulatory Visit (INDEPENDENT_AMBULATORY_CARE_PROVIDER_SITE_OTHER): Payer: BC Managed Care – PPO

## 2011-06-14 DIAGNOSIS — E8881 Metabolic syndrome: Secondary | ICD-10-CM

## 2011-06-14 DIAGNOSIS — T887XXA Unspecified adverse effect of drug or medicament, initial encounter: Secondary | ICD-10-CM

## 2011-06-14 DIAGNOSIS — IMO0001 Reserved for inherently not codable concepts without codable children: Secondary | ICD-10-CM

## 2011-06-14 LAB — HEPATIC FUNCTION PANEL
AST: 20 U/L (ref 0–37)
Albumin: 4 g/dL (ref 3.5–5.2)
Alkaline Phosphatase: 59 U/L (ref 39–117)
Total Protein: 6.9 g/dL (ref 6.0–8.3)

## 2011-06-14 LAB — LIPID PANEL: Cholesterol: 211 mg/dL — ABNORMAL HIGH (ref 0–200)

## 2011-06-14 LAB — MICROALBUMIN / CREATININE URINE RATIO: Microalb Creat Ratio: 12.2 mg/g (ref 0.0–30.0)

## 2011-06-15 ENCOUNTER — Other Ambulatory Visit: Payer: Self-pay | Admitting: Internal Medicine

## 2011-06-15 ENCOUNTER — Ambulatory Visit: Payer: Self-pay | Admitting: Internal Medicine

## 2011-06-15 ENCOUNTER — Encounter: Payer: Self-pay | Admitting: Internal Medicine

## 2011-06-15 ENCOUNTER — Ambulatory Visit (INDEPENDENT_AMBULATORY_CARE_PROVIDER_SITE_OTHER): Payer: BC Managed Care – PPO | Admitting: Internal Medicine

## 2011-06-15 DIAGNOSIS — E785 Hyperlipidemia, unspecified: Secondary | ICD-10-CM

## 2011-06-15 DIAGNOSIS — F39 Unspecified mood [affective] disorder: Secondary | ICD-10-CM

## 2011-06-15 DIAGNOSIS — E119 Type 2 diabetes mellitus without complications: Secondary | ICD-10-CM

## 2011-06-15 DIAGNOSIS — I10 Essential (primary) hypertension: Secondary | ICD-10-CM

## 2011-06-15 NOTE — Assessment & Plan Note (Signed)
Blood Pressure Goal  Ideally is an AVERAGE < 135/85. This AVERAGE should be calculated from @ least 5-7 BP readings taken @ different times of day on different days of week. You should not respond to isolated BP readings , but rather the AVERAGE for that week  

## 2011-06-15 NOTE — Progress Notes (Signed)
  Subjective:    Patient ID: Anthony Howell, male    DOB: Oct 12, 1960, 50 y.o.   MRN: [id number]  HPI HYPERTENSION: Disease Monitoring: Blood pressure range- not monitored  Chest pain, palpitations- no       Dyspnea- no Medications: Compliance- yes  Lightheadedness,Syncope- no    Edema- no  DIABETES: Disease Monitoring: Blood Sugar ranges-190s to 250s  Polyuria/phagia/dipsia- no       Visual problems- no; last Ophth exam 2011, no retinopathy Medications: Compliance- yes  Hypoglycemic symptoms- no  HYPERLIPIDEMIA: Disease Monitoring: See symptoms for Hypertension Medications: Compliance- yes  Abd pain, bowel changes- minor constipation from narcotics . Note: colonoscopy due , last done age 82 because his mother had colon polyps  Muscle aches- no  His A1c has improved from a value of 9.5% in December 2011 but still remains uncontrolled at 8.7%. LDL is 102.7, almost at minimal goal. HDL is protective at 51.3. Triglycerides have  increased to 386; he states his diet has been poor.          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 He states that she's had a "hole in her heart". He is unsure whether it's a patent foramen ovale or VSD.     Objective:   Physical Exam Gen.: Weight excess. Alert and cooperative throughout exam. Eyes: No corneal or conjunctival inflammation noted.  Mouth: Oral mucosa and oropharynx reveal no lesions or exudates.Minimal oropharyngeal erythema. Teeth in good repair. Neck: No deformities, masses, or tenderness noted.  Thyroid normal. Lungs: Normal respiratory effort; chest expands symmetrically. Lungs are clear to auscultation without rales, wheezes, or increased work of breathing.BS decreased diffusely Heart: Normal rate and rhythm. Normal S1 and S2. No gallop, click, or rub. S 4 w/o  murmur. Abdomen: Bowel  sounds normal; abdomen soft and nontender but protuberant. No masses, organomegaly or hernias noted.                                                                                    Musculoskeletal/extremities: No clubbing, cyanosis, edema noted. Pes planus.Joints normal. Very poor nail health  ; nails thickened & discolored Vascular: Carotid, radial artery, dorsalis pedis and  posterior tibial pulses are full and equal. No bruits present. Neurologic:  oriented x3. Deep tendon reflexes symmetrical and normal.   Light touch normal over feet  Skin: Intact without suspicious lesions or rashes. Lymph: No cervical, axillary lymphadenopathy present. Psych:  Mild anxiety suggested clinically                                                                                       Assessment & Plan:

## 2011-06-15 NOTE — Patient Instructions (Addendum)
The most common cause of elevated triglycerides is the ingestion of sugar from high fructose corn syrup sources added to processed foods & drinks.  Eat a low-fat diet with lots of fruits and vegetables, up to 7-9 servings per day. Consume less than 40 grams of sugar per day from foods & drinks with High Fructose Corn Syrup (HFCS) sugar as #1,2,3 or # 4 on label.Whole Foods, [employer] & [employer] do not carry products with HFCS. Follow a  low carb nutrition program such as [place] or [employer]  to prevent Diabetes progression . White carbohydrates (potatoes, rice, bread, and pasta) have a high spike of sugar and a high load of sugar. For example a  baked potato has a cup of sugar and a  french fry  2 teaspoons of sugar. Yams, wild  rice, whole grained bread &  wheat pasta have been much lower spike and load of  sugar. Portions should be the size of a deck of cards or your palm.  Exercise at least 30-45 minutes a day,  3-4 days a week.   Avoid obesity; your goal is waist measurement < 40 inches. Eye Doctor - have an eye exam @ least annually. Consider  [facility]'s smoking cessation program @ www.[place].com or (863)503-3459.  Please think about quitting smoking. Review the risks we discussed. Please call 1-800-QUIT-NOW  for free smoking cessation counseling.                                                        Health Care Power of Attorney & Living Will. Complete if not in place ; these place you in charge of your health care decisions. Please  schedule fasting Labs in 10-12  weeks : BMET,Lipids, TSH, A1c , urine microalbumin. PLEASE BRING THESE INSTRUCTIONS TO FOLLOW UP  LAB APPOINTMENT.This will guarantee correct labs are drawn, eliminating need for repeat blood sampling ( needle sticks ! ). Diagnoses /Codes: 250.02, 277.7.

## 2011-06-16 NOTE — Telephone Encounter (Signed)
Last seen 06/15/11, follow up labs in 10 weeks.  RX sent.

## 2011-06-26 NOTE — Assessment & Plan Note (Signed)
Anxiety is significant but appropriate in view of his mother's health problems and job stresses aggravated by his need to travel back and forth to [place]

## 2011-06-26 NOTE — Assessment & Plan Note (Addendum)
He admits to suboptimal nutritional monitor; his elevated triglycerides confirm this. Despite this LDL is almost at goal. Nutritional and exercise interventions should be employed rather than increasing medications. Lipids should be monitored after 10-12 weeks of these interventions. Nutrition should focus on a  low hypoglycemic carb program.

## 2011-06-26 NOTE — Assessment & Plan Note (Signed)
Diabetes control was improved but still not at goal. We discussed instituting insulin if there is not continued improvement in the next 4 months with therapeutic life changes including exercise and good nutrition.

## 2011-08-15 ENCOUNTER — Ambulatory Visit (INDEPENDENT_AMBULATORY_CARE_PROVIDER_SITE_OTHER): Payer: 59 | Admitting: Family Medicine

## 2011-08-15 ENCOUNTER — Telehealth: Payer: Self-pay | Admitting: Internal Medicine

## 2011-08-15 ENCOUNTER — Ambulatory Visit (INDEPENDENT_AMBULATORY_CARE_PROVIDER_SITE_OTHER)
Admission: RE | Admit: 2011-08-15 | Discharge: 2011-08-15 | Disposition: A | Discharge status: Home / Self Care | Payer: 59 | Source: Ambulatory Visit | Attending: Family Medicine | Admitting: Family Medicine

## 2011-08-15 ENCOUNTER — Encounter: Payer: Self-pay | Admitting: Family Medicine

## 2011-08-15 VITALS — BP 135/80 | HR 104 | Temp 98.1°F | Ht 72.0 in | Wt 299.8 lb

## 2011-08-15 DIAGNOSIS — M25439 Effusion, unspecified wrist: Secondary | ICD-10-CM | POA: Insufficient documentation

## 2011-08-15 MED ORDER — NAPROXEN 500 MG PO TABS: 500.0000 mg | ORAL_TABLET | Freq: Two times a day (BID) | ORAL | Status: DC

## 2011-08-15 MED ORDER — CEPHALEXIN 500 MG PO CAPS: 500.0000 mg | ORAL_CAPSULE | Freq: Two times a day (BID) | ORAL | Status: AC

## 2011-08-15 NOTE — Patient Instructions (Signed)
Go to [address] to get your xray Start the Keflex twice daily for infection Take the Naproxen twice daily (w/ food) for the next 5 days and then as needed for pain or swelling ICE Call with any questions or concerns Hang in there!!!

## 2011-08-15 NOTE — Telephone Encounter (Signed)
Noted  

## 2011-08-15 NOTE — Telephone Encounter (Signed)
Patient had an appt this morning at 9:45-SS  [facility] Triage Call Report Triage Record Num: [id number] Operator: Lesli Albee Patient Name: Anthony Howell Call Date & Time: 08/15/2011 9:18:23AM Patient Phone: [phone] PCP: Patient Gender: Male PCP Fax : Patient DOB: 05/22/1951 Practice Name: [facility] Reason for Call: Caller: [name]/Patient; PCP: Marga Melnick; CB#: [phone]; ; ; Call regarding Rt Wrist Is Swollen an Painful No Known Injury; Pt is calling about his right wrist being red and swollen and painful. Pt does not know if he had an injury. Rn triaged and advsied appt. Appt scheduled at 09:45 with Dr. Beverely Low. Protocol(s) Used: Wrist Non-Injury Recommended Outcome per Protocol: See Provider within 4 hours Reason for Outcome: New painful tightness or marked swelling in palm, wrist, hand or fingers Care Advice: ~ Remove any rings on the fingers of the affected hand, if possible. ~ Apply cloth-covered ice pack to affected area. Apply 20 minutes on, then 20 minutes off until evaluated by provider. ~ SYMPTOM / CONDITION MANAGEMENT Position affected part so it is elevated at least 12 inches (30 cm) above level of heart to improve circulation and decrease discomfort.

## 2011-08-15 NOTE — Progress Notes (Signed)
  Subjective:    Patient ID: Anthony Howell, male    DOB: Nov 22, 1960, 51 y.o.   MRN: [id number]  HPI R wrist pain- was in [facility] and noticed pain over ulnar styloid w/ some swelling.  Is now red, painful, swollen.  No known injury.  No pain w/ finger movement, wrist flexion/extension.  + pain w/ supination.   Review of Systems For ROS see HPI     Objective:   Physical Exam  Vitals reviewed. Constitutional: He appears well-developed and well-nourished. No distress.  Musculoskeletal:       Right wrist: He exhibits tenderness (TTP over ulnar styloid w/ overlying redness and swelling), bony tenderness (over ulnar styloid) and swelling. He exhibits normal range of motion (full flexion/extension, pain w/ suppination), no effusion (no actual joint swelling) and no deformity.          Assessment & Plan:

## 2011-08-15 NOTE — Assessment & Plan Note (Addendum)
New.  Area appears to be cellulitic/superficial infxn.  No actual joint swelling or pain w/ movement of wrist.  Pain w/ supination due to muscular attachment to ulnar styloid.  Get xray to assess.  Start abx.  Scheduled NSAIDs.  Reviewed supportive care and red flags that should prompt return.  Pt expressed understanding and is in agreement w/ plan.

## 2011-08-16 ENCOUNTER — Other Ambulatory Visit: Payer: Self-pay | Admitting: Internal Medicine

## 2011-08-17 NOTE — Telephone Encounter (Signed)
Please schedule fasting Labs in 10-12 weeks : BMET,Lipids, TSH, A1c , urine microalbumin. PLEASE BRING THESE INSTRUCTIONS TO FOLLOW UP LAB APPOINTMENT.This will guarantee correct labs are drawn, eliminating need for repeat blood sampling ( needle sticks ! ). Diagnoses /Codes: 250.02, 277.7.

## 2011-11-10 ENCOUNTER — Other Ambulatory Visit: Payer: Self-pay | Admitting: Internal Medicine

## 2011-11-10 NOTE — Telephone Encounter (Signed)
Patient called back and indicated that he would like Januvia and metformin filled. Patient will discuss Actos at his pending appointment.

## 2011-11-10 NOTE — Telephone Encounter (Signed)
I left message on VM for patient to return call when available. Reason for call was for patient to call and verify what medication he is taking for DM. Dr.Hopper no longer rx's actos as he informed me before.

## 2011-11-13 ENCOUNTER — Other Ambulatory Visit: Payer: 59

## 2011-12-26 ENCOUNTER — Other Ambulatory Visit: Payer: Self-pay | Admitting: Physical Medicine and Rehabilitation

## 2011-12-26 DIAGNOSIS — IMO0002 Reserved for concepts with insufficient information to code with codable children: Secondary | ICD-10-CM

## 2011-12-29 NOTE — Progress Notes (Signed)
Blood obtained from left [facility] space without difficulty for labs for MR this coming weekend; site unremarkable.  jkl

## 2011-12-31 ENCOUNTER — Ambulatory Visit
Admission: RE | Admit: 2011-12-31 | Discharge: 2011-12-31 | Disposition: A | Discharge status: Home / Self Care | Payer: 59 | Source: Ambulatory Visit | Attending: Physical Medicine and Rehabilitation | Admitting: Physical Medicine and Rehabilitation

## 2011-12-31 DIAGNOSIS — IMO0002 Reserved for concepts with insufficient information to code with codable children: Secondary | ICD-10-CM

## 2011-12-31 MED ORDER — GADOBENATE DIMEGLUMINE 529 MG/ML IV SOLN
20.0000 mL | Freq: Once | INTRAVENOUS | Status: AC | PRN
  Administered 2011-12-31: 20 mL via INTRAVENOUS

## 2012-02-13 ENCOUNTER — Telehealth: Payer: Self-pay | Admitting: Internal Medicine

## 2012-02-13 DIAGNOSIS — T887XXA Unspecified adverse effect of drug or medicament, initial encounter: Secondary | ICD-10-CM

## 2012-02-13 DIAGNOSIS — E785 Hyperlipidemia, unspecified: Secondary | ICD-10-CM

## 2012-02-13 DIAGNOSIS — I10 Essential (primary) hypertension: Secondary | ICD-10-CM

## 2012-02-13 DIAGNOSIS — E119 Type 2 diabetes mellitus without complications: Secondary | ICD-10-CM

## 2012-02-13 NOTE — Telephone Encounter (Signed)
Pt called wants to come in this Friday for fasting labs and then come in 8.27 for a lab follow up with medication renewal, please review and advise  Cb# [phone]

## 2012-02-13 NOTE — Telephone Encounter (Signed)
Add TSH , covered by 272.4

## 2012-02-13 NOTE — Telephone Encounter (Signed)
Hopp, I placed future order for Lipid/Hep/BMP/A1c/Malb 250.00/401/9/995.20/272.4, please advise if you would like any additional labs on patient and give diagnosis codes. (CBCD and TSH was NOT ordered-? Which code would support those labs). Patient did not schedule a CPX, will schedule a follow-up appointment.

## 2012-02-13 NOTE — Telephone Encounter (Signed)
TSH added to future order

## 2012-02-16 ENCOUNTER — Other Ambulatory Visit (INDEPENDENT_AMBULATORY_CARE_PROVIDER_SITE_OTHER): Payer: 59

## 2012-02-16 DIAGNOSIS — I10 Essential (primary) hypertension: Secondary | ICD-10-CM

## 2012-02-16 DIAGNOSIS — T887XXA Unspecified adverse effect of drug or medicament, initial encounter: Secondary | ICD-10-CM

## 2012-02-16 DIAGNOSIS — E119 Type 2 diabetes mellitus without complications: Secondary | ICD-10-CM

## 2012-02-16 DIAGNOSIS — E785 Hyperlipidemia, unspecified: Secondary | ICD-10-CM

## 2012-02-16 LAB — HEPATIC FUNCTION PANEL
ALT: 18 U/L (ref 0–53)
AST: 17 U/L (ref 0–37)
Bilirubin, Direct: 0.1 mg/dL (ref 0.0–0.3)
Total Bilirubin: 0.6 mg/dL (ref 0.3–1.2)
Total Protein: 6.9 g/dL (ref 6.0–8.3)

## 2012-02-16 LAB — HEMOGLOBIN A1C: Hgb A1c MFr Bld: 9 % — ABNORMAL HIGH (ref 4.6–6.5)

## 2012-02-16 LAB — BASIC METABOLIC PANEL
BUN: 13 mg/dL (ref 6–23)
Calcium: 9.1 mg/dL (ref 8.4–10.5)
GFR: 160.34 mL/min (ref >60.00)
Glucose, Bld: 194 mg/dL — ABNORMAL HIGH (ref 70–99)
Sodium: 137 mEq/L (ref 135–145)

## 2012-02-16 LAB — LIPID PANEL: Triglycerides: 298 mg/dL — ABNORMAL HIGH (ref 0.0–149.0)

## 2012-02-16 LAB — MICROALBUMIN / CREATININE URINE RATIO: Microalb, Ur: 6.1 mg/dL — ABNORMAL HIGH (ref 0.0–1.9)

## 2012-02-20 ENCOUNTER — Ambulatory Visit (INDEPENDENT_AMBULATORY_CARE_PROVIDER_SITE_OTHER): Payer: 59 | Admitting: Internal Medicine

## 2012-02-20 ENCOUNTER — Encounter: Payer: Self-pay | Admitting: Internal Medicine

## 2012-02-20 VITALS — BP 128/86 | HR 114 | Wt 282.4 lb

## 2012-02-20 DIAGNOSIS — E119 Type 2 diabetes mellitus without complications: Secondary | ICD-10-CM

## 2012-02-20 DIAGNOSIS — G4730 Sleep apnea, unspecified: Secondary | ICD-10-CM | POA: Insufficient documentation

## 2012-02-20 DIAGNOSIS — E785 Hyperlipidemia, unspecified: Secondary | ICD-10-CM

## 2012-02-20 DIAGNOSIS — I10 Essential (primary) hypertension: Secondary | ICD-10-CM

## 2012-02-20 DIAGNOSIS — G894 Chronic pain syndrome: Secondary | ICD-10-CM | POA: Insufficient documentation

## 2012-02-20 MED ORDER — LIRAGLUTIDE 18 MG/3ML ~~LOC~~ SOLN: SUBCUTANEOUS | Status: DC

## 2012-02-20 NOTE — Progress Notes (Signed)
  Subjective:    Patient ID: Anthony Howell, male    DOB: November 30, 1960, 51 y.o.   MRN: [id number]  HPI HYPERTENSION: Disease Monitoring: Blood pressure range-[facility] 140/88 or <  Chest pain, palpitations- no       Dyspnea- no Medications: Compliance- yes  Lightheadedness,Syncope- no    Edema- no  DIABETES: A1c 9%( average sugar 212; risk increased 80 %); microalbumin 6.1 Disease Monitoring: Blood Sugar ranges-FBS in 180s  Polyuria/phagia/dipsia-no       Visual problems- no ; no retinopathy on Ophth exam 7/13 Medications: Compliance- yes  Hypoglycemic symptoms- no  HYPERLIPIDEMIA: TG 298;HDL 46.6;LDL 107.3 Disease Monitoring: See symptoms for Hypertension Medications: Compliance- yes  Abd pain, bowel changes- no   Muscle aches- no  Smoking Status : 1/2 ppd       Review of Systems  Sleep Apnea diagnosed on testing;Dr Dohmier to Rx CPAP. Dr Murray Hodgkins has recommended D/Cing narcotic due to Sleep Apnea.Intractable sciatica LLE precludes exercise     Objective:   Physical Exam Gen.: Weight excess. Alert, appropriate and cooperative throughout exam.  Eyes: No corneal or conjunctival inflammation noted. Pupils equal round reactive to light and accommodation.  Neck: No deformities, masses, or tenderness noted. Thyroid normal. Lungs: Normal respiratory effort; chest expands symmetrically. Lungs: minimal scattered rhonchi; increased work of breathing. Heart: Normal rate and rhythm. Normal S1 and S2. No gallop, click, or rub. S4 w/o murmur. Abdomen: Bowel sounds normal; abdomen protuberant but soft and nontender. No masses, organomegaly . Small umbilical hernia noted.                                                                 Musculoskeletal/extremities:  No clubbing, cyanosis, edema, or deformity noted. Poor toe nail  health . Light touch decreased over L foot. Pes planus Vascular: Carotid, radial artery, dorsalis pedis and  posterior tibial pulses are full and equal. No bruits  present. Neurologic: Alert and oriented x3. Deep tendon reflexes symmetrical and normal.          Skin: Intact without suspicious lesions or rashes.Diaphoretic Lymph: No cervical, axillary lymphadenopathy present. Psych: Mood and affect are normal. Normally interactive                                                                                         Assessment & Plan:

## 2012-02-20 NOTE — Assessment & Plan Note (Signed)
CPAP pending 

## 2012-02-20 NOTE — Assessment & Plan Note (Signed)
BP control good; no change indicated

## 2012-02-20 NOTE — Assessment & Plan Note (Signed)
Trial of Victoza

## 2012-02-20 NOTE — Assessment & Plan Note (Signed)
Nutrition discussed concerning TG elevated

## 2012-02-20 NOTE — Patient Instructions (Addendum)
Please  schedule fasting Labs in 10weeks after nutrition changes  & Victoza : Lipids,  A1c. PLEASE BRING THESE INSTRUCTIONS TO FOLLOW UP  LAB APPOINTMENT.This will guarantee correct labs are drawn, eliminating need for repeat blood sampling ( needle sticks ! ). Diagnoses /Codes: 272.4,250.02.  PLEASE BRING THESE INSTRUCTIONS TO FOLLOW UP  LAB APPOINTMENT.This will guarantee correct labs are drawn, eliminating need for repeat blood sampling ( needle sticks ! ).   The most common cause of elevated triglycerides is the ingestion of sugar from high fructose corn syrup sources added to processed foods & drinks.  Eat a low-fat diet with lots of fruits and vegetables, up to 7-9 servings per day. Consume less than 40 (preferably ZERO) grams of sugar per day from foods & drinks with High Fructose Corn Syrup (HFCS) sugar as #1,2,3 or # 4 on label.Whole Foods, Tr[employer] Joes & [employer] do not carry products with HFCS. Follow a  low carb nutrition program such as [place] or The New Sugar Busters  to prevent Diabetes progression . White carbohydrates (potatoes, rice, bread, and pasta) have a high spike of sugar and a high load of sugar. For example a  baked potato has a cup of sugar and a  french fry  2 teaspoons of sugar. Yams, wild  rice, whole grained bread &  wheat pasta have been much lower spike and load of  sugar. Portions should be the size of a deck of cards or your palm.   If you activate My Chart; the results can be released to you as soon as they populate from the lab. If you choose not to use this program; the labs have to be reviewed, copied & mailed   causing a delay in getting the results to you.

## 2012-02-22 ENCOUNTER — Other Ambulatory Visit: Payer: Self-pay

## 2012-02-22 MED ORDER — METFORMIN HCL 1000 MG PO TABS: ORAL_TABLET | ORAL | Status: DC

## 2012-02-22 MED ORDER — METOPROLOL TARTRATE 50 MG PO TABS: ORAL_TABLET | ORAL | Status: DC

## 2012-02-22 MED ORDER — JANUVIA 100 MG PO TABS: ORAL_TABLET | ORAL | Status: DC

## 2012-02-22 MED ORDER — CRESTOR 20 MG PO TABS: ORAL_TABLET | ORAL | Status: DC

## 2012-02-22 MED ORDER — LISINOPRIL 40 MG PO TABS: ORAL_TABLET | ORAL | Status: DC

## 2012-02-22 MED ORDER — GLIMEPIRIDE 4 MG PO TABS: ORAL_TABLET | ORAL | Status: DC

## 2012-02-27 ENCOUNTER — Other Ambulatory Visit: Payer: Self-pay | Admitting: Internal Medicine

## 2012-02-27 MED ORDER — GLIMEPIRIDE 4 MG PO TABS: ORAL_TABLET | ORAL | Status: DC

## 2012-02-27 MED ORDER — METFORMIN HCL 1000 MG PO TABS: ORAL_TABLET | ORAL | Status: DC

## 2012-02-27 MED ORDER — METOPROLOL TARTRATE 50 MG PO TABS: ORAL_TABLET | ORAL | Status: DC

## 2012-02-27 NOTE — Telephone Encounter (Signed)
Patient aware 3 rx's sent over. Samples of Januvia to be given

## 2012-02-27 NOTE — Telephone Encounter (Signed)
Pt called mail order is not going to get his meds to him in enough time. He would like a 14 day refill of Metformin HCI, metorpolol tartrate, Glimepiride, and sitagliptin phosphate. Please call him when ready, amym

## 2012-03-14 ENCOUNTER — Telehealth: Payer: Self-pay | Admitting: Internal Medicine

## 2012-03-14 ENCOUNTER — Other Ambulatory Visit: Payer: Self-pay | Admitting: Internal Medicine

## 2012-03-14 DIAGNOSIS — E119 Type 2 diabetes mellitus without complications: Secondary | ICD-10-CM

## 2012-03-14 MED ORDER — LIRAGLUTIDE 18 MG/3ML ~~LOC~~ SOLN: SUBCUTANEOUS | Status: DC

## 2012-03-14 NOTE — Telephone Encounter (Signed)
Paperwork faxed back, patient aware

## 2012-03-14 NOTE — Telephone Encounter (Signed)
Paperwork received and placed on ledge for sig from MD then to be faxed back

## 2012-03-14 NOTE — Telephone Encounter (Signed)
DUPLICATE REQUEST

## 2012-03-14 NOTE — Telephone Encounter (Signed)
victoza 2 pak 18 mg/ 3 ml pen Qty:6.0 ml Inject 0.6mg  daily for 7 to 14 days then 1.2 mg daily

## 2012-03-14 NOTE — Telephone Encounter (Signed)
I called [employer]) to initiate prior auth process. Paperwork requested to be faxed to [phone].  Case ID # [id number]

## 2012-03-14 NOTE — Telephone Encounter (Signed)
Pt states he would like [place] to call [employer] at [phone] to get prior authorization for his Victoza. He would like [place] to call him when this is completed so [facility] can [facility]. Call pt back at [phone]

## 2012-03-14 NOTE — Telephone Encounter (Signed)
victoza 2 pak 18 mg / 3 ml pen Qty:70ml Inject 0.6mg  gaily for 7 to 14 days then 1.2 mg daily

## 2012-03-15 NOTE — Telephone Encounter (Signed)
Paperwork received, Victoza was approved from 02/13/2012-03/14/2013, [facility] [place], [address]  Patient aware

## 2012-03-18 ENCOUNTER — Other Ambulatory Visit: Payer: Self-pay

## 2012-03-18 MED ORDER — INSULIN PEN NEEDLE 32G X 6 MM MISC: Status: DC

## 2012-03-29 ENCOUNTER — Encounter: Payer: Self-pay | Admitting: Internal Medicine

## 2012-03-29 ENCOUNTER — Ambulatory Visit (INDEPENDENT_AMBULATORY_CARE_PROVIDER_SITE_OTHER): Payer: 59 | Admitting: Internal Medicine

## 2012-03-29 VITALS — BP 140/92 | HR 114 | Temp 98.1°F | Wt 274.0 lb

## 2012-03-29 DIAGNOSIS — Z23 Encounter for immunization: Secondary | ICD-10-CM

## 2012-03-29 DIAGNOSIS — G4730 Sleep apnea, unspecified: Secondary | ICD-10-CM

## 2012-03-29 DIAGNOSIS — G8928 Other chronic postprocedural pain: Secondary | ICD-10-CM

## 2012-03-29 MED ORDER — OXYCODONE HCL 10 MG PO TB12: 10.0000 mg | ORAL_TABLET | Freq: Three times a day (TID) | ORAL | Status: DC

## 2012-03-29 NOTE — Progress Notes (Signed)
  Subjective:    Patient ID: Anthony Howell, male    DOB: Apr 09, 1961, 51 y.o.   MRN: [id number]  HPI Because of documented sleep apnea 01/23/12 his pain specialist had wanted him to wean and discontinue OxyContin. 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  He has been losing weight progressively in the context of nutrition changes as well as initiation of Victoza. He has probably lost 20 pounds between the apnea study 7/ 30 and repeat study 03/03/12 which demonstrated significant improvement in respiratory events. AHI was 0.0 per hour with a oxygen saturation nadir of 90%. He did exhibit significant periodic limb movements. He attributes that to withdrawal from the narcotics.  He is requesting a referral to another pain clinic.It is his perception that Dr Murray Hodgkins will not compromise on narcotic withdrawal.   Review of Systems With Victoza and significant nutritional changes his fasting blood sugars are  ranging from 117-130. Followup A1c is scheduled later this month.  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     Objective:   Physical Exam    He is alert, focused and oriented. He is being forthright with me as to the issues concerning the narcotic pain meds.  He is exhibiting motivation to control his diabetes.This is a definite change from prior interactions.        Assessment & Plan:  #1 sleep apnea, significantly improved with weight loss  #2 chronic pain syndrome; his request to continue the narcotic medication appears appropriate to me, especially in view of the improvement in the sleep issues.Suboptimal pain control will adversely impact sleep hygiene  #3 improved diabetes control  Plan: Referral will be made to a different pain clinic. I have worked with [name]' multiple decades and will send a copy this note to him requesting to  consider Anthony Howell as a patient. I told Anthony Howell I  have been impressed with Dr. Vear Clock' compassion and professional skills treating our mutual patients through the years.

## 2012-03-29 NOTE — Patient Instructions (Addendum)
Review and correct the record as indicated. Please share record with all medical staff seen.  

## 2012-03-30 ENCOUNTER — Encounter: Payer: Self-pay | Admitting: Internal Medicine

## 2012-04-08 ENCOUNTER — Other Ambulatory Visit: Payer: Self-pay | Admitting: Internal Medicine

## 2012-04-08 NOTE — Telephone Encounter (Signed)
I am terribly sorry; Cymbalta at this dose is NOT a standard & is considered  "off label"; I am unable to prescribe higher than 60 mg daily.

## 2012-04-08 NOTE — Telephone Encounter (Signed)
Pt states he now takes 120 mg Cymbalta. He states his pain mgmt doctor increased this because it helps with his sciatic nerve pain. Patient requests we fill this until he gets into his new pain mgmt doctor.

## 2012-04-08 NOTE — Telephone Encounter (Signed)
Dr.Hopper please advise 

## 2012-04-09 NOTE — Telephone Encounter (Signed)
I spoke with patient and Dr.Hopper spoke with patient. Patient understood why we are unable to rx 120 mg and is ok with 60 mg and once he establishes with New Pain management doctor he will discuss increasing back to 120 mg

## 2012-04-09 NOTE — Telephone Encounter (Signed)
Left message on voicemail for patient to return call when available   

## 2012-04-26 ENCOUNTER — Ambulatory Visit (INDEPENDENT_AMBULATORY_CARE_PROVIDER_SITE_OTHER): Payer: 59 | Admitting: Internal Medicine

## 2012-04-26 ENCOUNTER — Encounter: Payer: Self-pay | Admitting: Internal Medicine

## 2012-04-26 VITALS — BP 128/88 | HR 96 | Wt 273.4 lb

## 2012-04-26 DIAGNOSIS — G4730 Sleep apnea, unspecified: Secondary | ICD-10-CM

## 2012-04-26 DIAGNOSIS — G894 Chronic pain syndrome: Secondary | ICD-10-CM

## 2012-04-26 DIAGNOSIS — G8928 Other chronic postprocedural pain: Secondary | ICD-10-CM

## 2012-04-26 MED ORDER — OXYCODONE HCL 10 MG PO TB12: 10.0000 mg | ORAL_TABLET | Freq: Three times a day (TID) | ORAL | Status: DC

## 2012-04-26 NOTE — Patient Instructions (Signed)
Review and correct the record as indicated. Please share record with all medical staff seen.  

## 2012-04-26 NOTE — Progress Notes (Signed)
  Subjective:    Patient ID: Anthony Howell, male    DOB: October 23, 1960, 52 y.o.   MRN: [id number]  HPI  Medical records have been requested by our referral coordinator to facilitate transfer of care from [name] to [name]. Record request has been faxed twice and personal contact has been made with [name], medical records staff person at the facility. Our referral coordinator has been informed of the records were copied 04/25/12; as of today none have been received. His present prescription I wrote pending this transfer will be depleted as of 11/2.   Review of Systems  His sleep apnea was monitored with a assessment of event "chip" recorder; he is not having apnea but he is awakening with nocturnal pain. [name] has stressed that she does not want him taking the pain medication near bedtime because of potential respiratory suppression. He fully understands this risk. She has discussed this with me personally as well      Objective:   Physical Exam   His alert and oriented; very communicative and open.  Nares are patent with no obstruction. There is marked crowding of the oropharynx with poor visualization.  Breath sounds are decreased; he has scattered low-grade wheeze.  Rhythm is regular with an S4; no significant murmurs are noted         Assessment  & Plan:   #1 chronic pain syndrome; attention then made to establish care with [name] at my direct request.  #2 sleep apnea; potential risk of narcotics are taken in the evening. Patient is fully aware of this.  Plan he was given a copy of his release of records form to take to [name]

## 2012-05-03 ENCOUNTER — Other Ambulatory Visit: Payer: Self-pay

## 2012-05-03 DIAGNOSIS — E119 Type 2 diabetes mellitus without complications: Secondary | ICD-10-CM

## 2012-05-03 MED ORDER — LIRAGLUTIDE 18 MG/3ML ~~LOC~~ SOLN: SUBCUTANEOUS | Status: DC

## 2012-05-03 NOTE — Telephone Encounter (Signed)
RX sent

## 2012-05-18 ENCOUNTER — Other Ambulatory Visit: Payer: Self-pay | Admitting: Internal Medicine

## 2012-05-22 ENCOUNTER — Telehealth: Payer: Self-pay | Admitting: Internal Medicine

## 2012-05-22 DIAGNOSIS — G8928 Other chronic postprocedural pain: Secondary | ICD-10-CM

## 2012-05-22 MED ORDER — OXYCODONE HCL 10 MG PO TB12: 10.0000 mg | ORAL_TABLET | Freq: Three times a day (TID) | ORAL | Status: DC

## 2012-05-22 NOTE — Telephone Encounter (Signed)
I called patient to discuss refill request and he states his last refill was for 30 mg 1 by mouth TID, DO NOT TAKE AFTER 6 PM   Hopp please advise if ok to give @ requested dose and instructions

## 2012-05-22 NOTE — Telephone Encounter (Signed)
Patient called to request rx for oxycodone and oxycontin. He states he will run out on 05/27/12 and his pain mgmt appt is not until 06/05/12. Call [phone].

## 2012-05-22 NOTE — Telephone Encounter (Signed)
Rx not found. Reprinted Rx per [facility].    MW

## 2012-05-22 NOTE — Telephone Encounter (Signed)
#   30 OK , same instructions

## 2012-06-17 ENCOUNTER — Other Ambulatory Visit: Payer: Self-pay | Admitting: Internal Medicine

## 2012-08-22 ENCOUNTER — Other Ambulatory Visit: Payer: Self-pay | Admitting: Internal Medicine

## 2012-10-31 ENCOUNTER — Other Ambulatory Visit: Payer: Self-pay | Admitting: Internal Medicine

## 2012-11-01 NOTE — Telephone Encounter (Signed)
Pt due for labs and OV.

## 2012-11-26 ENCOUNTER — Other Ambulatory Visit: Payer: Self-pay | Admitting: Neurosurgery

## 2012-11-26 ENCOUNTER — Other Ambulatory Visit: Payer: Self-pay | Admitting: Physical Medicine and Rehabilitation

## 2012-11-26 DIAGNOSIS — M79605 Pain in left leg: Secondary | ICD-10-CM

## 2012-12-16 ENCOUNTER — Telehealth: Payer: Self-pay | Admitting: Internal Medicine

## 2012-12-16 MED ORDER — LISINOPRIL 40 MG PO TABS: ORAL_TABLET | ORAL | Status: DC

## 2012-12-16 NOTE — Addendum Note (Signed)
Addended by: Verdene Rio on: 12/16/2012 06:32 PM   Modules accepted: Orders

## 2012-12-16 NOTE — Telephone Encounter (Signed)
Pt calling again regarding rx for lisinopril. He states he has no medication and needs this today.

## 2012-12-16 NOTE — Telephone Encounter (Signed)
OK X1 

## 2012-12-16 NOTE — Telephone Encounter (Signed)
Rx sent. Left detail message.

## 2012-12-16 NOTE — Telephone Encounter (Signed)
Patient requests 30 day supply of lisinopril 40 mg sent to [facility]. He has appt scheduled for next week. Patient states he needs this today.

## 2012-12-24 LAB — HM DIABETES EYE EXAM

## 2012-12-25 ENCOUNTER — Ambulatory Visit: Payer: 59 | Admitting: Internal Medicine

## 2012-12-30 ENCOUNTER — Telehealth: Payer: Self-pay

## 2012-12-30 DIAGNOSIS — T887XXA Unspecified adverse effect of drug or medicament, initial encounter: Secondary | ICD-10-CM

## 2012-12-30 DIAGNOSIS — E785 Hyperlipidemia, unspecified: Secondary | ICD-10-CM

## 2012-12-30 NOTE — Telephone Encounter (Signed)
Message copied by Maurice Small on Mon Dec 30, 2012  8:46 AM ------      Message from: Pecola Lawless      Created: Mon Dec 30, 2012  8:36 AM       Please  schedule fasting Labs : BMET,Lipids, hepatic panel, TSH, A1c , urine microalbumin. Codes: 250.02, 272.4, 995.20.Appt 2-3 after labs ------

## 2012-12-30 NOTE — Telephone Encounter (Signed)
Spoke with patient, scheduled appointment for Wed (fasting labs), patient will call to scheduled follow-up once lab results back. (Future orders placed)

## 2013-01-01 ENCOUNTER — Other Ambulatory Visit (INDEPENDENT_AMBULATORY_CARE_PROVIDER_SITE_OTHER): Payer: 59

## 2013-01-01 DIAGNOSIS — E785 Hyperlipidemia, unspecified: Secondary | ICD-10-CM

## 2013-01-01 DIAGNOSIS — T887XXA Unspecified adverse effect of drug or medicament, initial encounter: Secondary | ICD-10-CM

## 2013-01-01 DIAGNOSIS — IMO0001 Reserved for inherently not codable concepts without codable children: Secondary | ICD-10-CM

## 2013-01-01 LAB — HEPATIC FUNCTION PANEL
ALT: 17 U/L (ref 0–53)
AST: 16 U/L (ref 0–37)
Albumin: 3.7 g/dL (ref 3.5–5.2)
Total Protein: 6.5 g/dL (ref 6.0–8.3)

## 2013-01-01 LAB — MICROALBUMIN / CREATININE URINE RATIO: Microalb, Ur: 7.4 mg/dL — ABNORMAL HIGH (ref 0.0–1.9)

## 2013-01-01 LAB — BASIC METABOLIC PANEL
Calcium: 9 mg/dL (ref 8.4–10.5)
GFR: 153.55 mL/min (ref >60.00)
Glucose, Bld: 221 mg/dL — ABNORMAL HIGH (ref 70–99)
Sodium: 136 mEq/L (ref 135–145)

## 2013-01-01 LAB — TSH: TSH: 1.96 u[IU]/mL (ref 0.35–5.50)

## 2013-01-01 LAB — LIPID PANEL
Cholesterol: 173 mg/dL (ref 0–200)
Triglycerides: 273 mg/dL — ABNORMAL HIGH (ref 0.0–149.0)

## 2013-01-01 LAB — HEMOGLOBIN A1C: Hgb A1c MFr Bld: 10.5 % — ABNORMAL HIGH (ref 4.6–6.5)

## 2013-01-29 ENCOUNTER — Other Ambulatory Visit: Payer: Self-pay | Admitting: Internal Medicine

## 2013-02-12 ENCOUNTER — Other Ambulatory Visit: Payer: Self-pay | Admitting: Internal Medicine

## 2013-05-01 ENCOUNTER — Other Ambulatory Visit: Payer: Self-pay

## 2013-05-12 ENCOUNTER — Encounter: Payer: Self-pay | Admitting: Internal Medicine

## 2013-05-12 ENCOUNTER — Ambulatory Visit (INDEPENDENT_AMBULATORY_CARE_PROVIDER_SITE_OTHER): Payer: 59 | Admitting: Internal Medicine

## 2013-05-12 VITALS — BP 186/121 | HR 80 | Temp 98.4°F | Resp 15 | Ht 72.25 in | Wt 286.2 lb

## 2013-05-12 DIAGNOSIS — E119 Type 2 diabetes mellitus without complications: Secondary | ICD-10-CM | POA: Insufficient documentation

## 2013-05-12 DIAGNOSIS — F17200 Nicotine dependence, unspecified, uncomplicated: Secondary | ICD-10-CM

## 2013-05-12 DIAGNOSIS — Z23 Encounter for immunization: Secondary | ICD-10-CM

## 2013-05-12 DIAGNOSIS — I10 Essential (primary) hypertension: Secondary | ICD-10-CM

## 2013-05-12 DIAGNOSIS — E1149 Type 2 diabetes mellitus with other diabetic neurological complication: Secondary | ICD-10-CM

## 2013-05-12 DIAGNOSIS — E785 Hyperlipidemia, unspecified: Secondary | ICD-10-CM

## 2013-05-12 LAB — BASIC METABOLIC PANEL
Calcium: 9.5 mg/dL (ref 8.4–10.5)
GFR: 132.41 mL/min (ref >60.00)
Glucose, Bld: 234 mg/dL — ABNORMAL HIGH (ref 70–99)
Sodium: 138 mEq/L (ref 135–145)

## 2013-05-12 LAB — HEMOGLOBIN A1C: Hgb A1c MFr Bld: 9.7 % — ABNORMAL HIGH (ref 4.6–6.5)

## 2013-05-12 MED ORDER — DILTIAZEM HCL ER COATED BEADS 240 MG PO CP24: 240.0000 mg | ORAL_CAPSULE | Freq: Every day | ORAL | Status: DC

## 2013-05-12 NOTE — Patient Instructions (Signed)
Your next office appointment will be determined based upon review of your pending labs . Those instructions will be transmitted to you through My Chart .  Eat a low-fat diet with lots of fruits and vegetables, up to 7-9 servings per day. Consume less than 40  Grams (preferably ZERO) of sugar per day from foods & drinks with High Fructose Corn Syrup (HFCS) sugar as #1,2,3 or # 4 on label.Whole Foods, [employer] Joes & [employer] do not carry products with HFCS. Follow a  low carb nutrition program such as [place] or The New Sugar Busters  to prevent Diabetes progression . White carbohydrates (potatoes, rice, bread, and pasta) have a high spike of sugar and a high load of sugar. For example a  baked potato has a cup of sugar and a  french fry  2 teaspoons of sugar. Yams, wild  rice, whole grained bread &  wheat pasta have been much lower spike and load of  sugar. Portions should be the size of a deck of cards or your palm. Minimal Blood Pressure Goal= AVERAGE < 140/90;  Ideal is an AVERAGE < 135/85. This AVERAGE should be calculated from @ least 5-7 BP readings taken @ different times of day on different days of week. You should not respond to isolated BP readings , but rather the AVERAGE for that week .Please bring your  blood pressure cuff to office visits to verify that it is reliable.It  can also be checked against the blood pressure device at the pharmacy. Finger or wrist cuffs are not dependable; an arm cuff is. Please think about quitting smoking. Review the risks we discussed. Please call 1-800-QUIT-NOW (424-473-2611) for free smoking cessation counseling.

## 2013-05-12 NOTE — Assessment & Plan Note (Addendum)
?   Validity of outside recordings Goals discussed BMET

## 2013-05-12 NOTE — Progress Notes (Signed)
Subjective:    Patient ID: Anthony Howell, male    DOB: 1960-12-02, 52 y.o.   MRN: [id number]  HPI The patient is here for followup of diabetes, hyperlipidemia, and hypertension.  The most recent A1c  01/01/13 was 10.2 % , which correlates to an average sugar of 285  , and long-term risk of  > 100 % . Victoza D/Ced 4/14 due to lack of insurance coverage. Fasting blood sugar 90-100.  Highest two-hour postprandial glucose is <  165 . No hypoglycemia reported Last ophthalmologic examination July  revealed no retinopathy. No active podiatry assessment on record but appt next week. Diet : no HFCS; low carb ; "but I have a pasta fetish". Unable to exercise due to sciatica.  The most recent lipids 01/01/13  reveal LDL 99.3 ; HDL 46.6 ; and triglycerides  273  . There is medical compliance with the statin.  Blood pressure range   115-130/ 90-110 @ pain Clinic . There is medical compliance with antihypertensive medications  No  medication adverse effects suggested of         Review of Systems Constitutional: Significant weight change from > 300 to 285. No excess fatigue Eyes: No  blurred vision;  double vision ; loss of vision Cardiovascular: no chest pain ;palpitations; racing; irregular rhythm ;syncope; claudication ; edema  Respiratory: some exertional dyspnea with hills;no  paroxysmal nocturnal dyspnea Genitourinary: No dysuria; hematuria ; pyuria; frequency;  Incontinence. Nocturia 2-3 x/ night & occasional urgengency Musculoskeletal: No myalgias or muscle cramping  Dermatologic: No non healing  Lesions; change in color or temperature of skin Neurologic: LLE  limb weakness & numbness . Using cane.No tingling or burning Endocrine: Chronic changes in skin  & nails. No excessive thirst; excessive hunger;  excessive urination      Objective:   Physical Exam Gen.: overweight but adequately nourished in appearance. Affect slightly flat; cooperative throughout exam.  Eyes: No corneal  or conjunctival inflammation noted.  Mouth: Oral mucosa and oropharynx reveal no lesions or exudates. Teeth in good repair. Neck: No deformities, masses, or tenderness noted.  Thyroid normal. Lungs: Normal respiratory effort; chest expands symmetrically. Lungs are clear to auscultation without rales, wheezes, or increased work of breathing. Decreased BS Heart: Normal rate and rhythm. Normal S1 and S2. No gallop, click, or rub. Distant heart sounds; no murmur. Abdomen: Bowel sounds normal; abdomen soft and nontender. No masses or organomegaly. Small umbilical hernia noted.                                  Musculoskeletal/extremities: Accentuated curvature of upper thoracic spine. No clubbing, cyanosis, edema, or significant extremity  deformity noted. Range of motion decreased L knee .Tone & strength normal. Hand joints normal . Toenail health poor. Pes planus Able to lie down & sit up w/o help. Negative SLR bilaterally Vascular: Carotid, radial artery, dorsalis pedis and  posterior tibial pulses are  equal.Decreased pedal pulses.No bruits present. Neurologic: Alert and oriented x3. Deep tendon reflexes asymmetrical ;1& 1/2 LLE .Asymmetric decreased light touch in feet       Skin: Calluses of feet; skin dry over feet Lymph: No cervical, axillary lymphadenopathy present. Psych: Mood and affect are normal. Normally interactive  Assessment & Plan:  See Current Assessment & Plan in Problem List under specific Diagnosis

## 2013-05-12 NOTE — Progress Notes (Signed)
Pre visit review using our clinic review tool, if applicable. No additional management support is needed unless otherwise documented below in the visit note. 

## 2013-05-12 NOTE — Assessment & Plan Note (Signed)
Risks discussed 

## 2013-05-12 NOTE — Assessment & Plan Note (Addendum)
Eat a low-fat diet with lots of fruits and vegetables, up to 7-9 servings per day. Consume less than 40* Grams (preferably ZERO) of sugar per day from foods & drinks with High Fructose Corn Syrup (HFCS) sugar as #1,2,3 or # 4 on label.Whole Foods, [employer] Jo[employer] & [employer] do not carry products with HFCS. Follow a  low carb nutrition program such as [place] or The New Sugar Busters  to prevent Diabetes progression . White carbohydrates (potatoes, rice, bread, and pasta) have a high spike of sugar and a high load of sugar. For example a  baked potato has a cup of sugar and a  french fry  2 teaspoons of sugar. Yams, wild  rice, whole grained bread &  wheat pasta have been much lower spike and load of  sugar. Portions should be the size of a deck of cards or your palm. 

## 2013-05-13 NOTE — Assessment & Plan Note (Signed)
Recheck A1c 

## 2013-07-01 ENCOUNTER — Encounter: Payer: Self-pay | Admitting: Internal Medicine

## 2013-07-02 ENCOUNTER — Other Ambulatory Visit: Payer: Self-pay | Admitting: *Deleted

## 2013-07-02 MED ORDER — METFORMIN HCL 1000 MG PO TABS: ORAL_TABLET | ORAL | Status: DC

## 2013-07-14 ENCOUNTER — Other Ambulatory Visit: Payer: Self-pay | Admitting: *Deleted

## 2013-07-14 ENCOUNTER — Other Ambulatory Visit: Payer: Self-pay | Admitting: Internal Medicine

## 2013-07-14 DIAGNOSIS — E1149 Type 2 diabetes mellitus with other diabetic neurological complication: Secondary | ICD-10-CM

## 2013-07-14 MED ORDER — LISINOPRIL 40 MG PO TABS: ORAL_TABLET | ORAL | Status: DC

## 2013-07-14 NOTE — Telephone Encounter (Signed)
What does patient need Endocrinology referral for? Please advise

## 2013-07-23 ENCOUNTER — Other Ambulatory Visit: Payer: Self-pay | Admitting: *Deleted

## 2013-07-23 ENCOUNTER — Encounter: Payer: Self-pay | Admitting: Endocrinology

## 2013-07-23 ENCOUNTER — Ambulatory Visit (INDEPENDENT_AMBULATORY_CARE_PROVIDER_SITE_OTHER): Payer: 59 | Admitting: Endocrinology

## 2013-07-23 ENCOUNTER — Other Ambulatory Visit: Payer: Self-pay

## 2013-07-23 VITALS — BP 108/80 | HR 117 | Temp 97.8°F | Ht 72.0 in | Wt 255.0 lb

## 2013-07-23 DIAGNOSIS — J45909 Unspecified asthma, uncomplicated: Secondary | ICD-10-CM

## 2013-07-23 DIAGNOSIS — E1149 Type 2 diabetes mellitus with other diabetic neurological complication: Secondary | ICD-10-CM

## 2013-07-23 MED ORDER — ALBUTEROL SULFATE HFA 108 (90 BASE) MCG/ACT IN AERS: INHALATION_SPRAY | RESPIRATORY_TRACT | Status: DC

## 2013-07-23 MED ORDER — GLIMEPIRIDE 4 MG PO TABS: ORAL_TABLET | ORAL | Status: DC

## 2013-07-23 MED ORDER — PIOGLITAZONE HCL 45 MG PO TABS: 45.0000 mg | ORAL_TABLET | Freq: Every day | ORAL | Status: DC

## 2013-07-23 NOTE — Patient Instructions (Addendum)
check your blood sugar once a day (buy a [employer] or [employer] brand meter).  vary the time of day when you check, between before the 3 meals, and at bedtime.  also check if you have symptoms of your blood sugar being too high or too low.  please keep a record of the readings and bring it to your next appointment here.  You can write it on any piece of paper.  please call us sooner if your blood sugar goes below 70, or if you have a lot of readings over 200. Please continue the metformin, glimepiride, and januvia (here are some samples).   i [facility], to add "pioglitizone."   Please come back for a follow-up appointment in 1 month.

## 2013-07-23 NOTE — Progress Notes (Signed)
Subjective:    Patient ID: Anthony Howell, male    DOB: 05-11-1961, 53 y.o.   MRN: [id number]  HPI pt states DM was dx'ed in 2002; he has mild if any neuropathy of the lower extremities (he has sciatica); he is unaware of any associated chronic complications.  he has never been on insulin.  pt says his diet is good, but exercise is limited by health problems.  He says lack of health insurance limits his ability to care for himself, or to check cbg's.   Past Medical History  Diagnosis Date  . Hypertension   . Diabetes mellitus     AODM  . Hyperlipidemia   . Asthma   . Arthritis     right shoulder and knee  . Psoriasis   . DVT (deep venous thrombosis)     peri operatively @ back surgery    Past Surgical History  Procedure Laterality Date  . Tonsillectomy    . Micrdisectomy      complicated by DVT    History   Social History  . Marital Status: Single    Spouse Name: N/A    Number of Children: N/A  . Years of Education: N/A   Occupational History  . Not on file.   Social History Main Topics  . Smoking status: Current Every Day Smoker -- 0.50 packs/day  . Smokeless tobacco: Not on file  . Alcohol Use: Yes     Comment:  very rarely  . Drug Use: No  . Sexual Activity:    Other Topics Concern  . Not on file   Social History Narrative  . No narrative on file    Current Outpatient Prescriptions on File Prior to Visit  Medication Sig Dispense Refill  . aspirin 81 MG tablet Take 81 mg by mouth daily.        . calcipotriene (DOVONOX) 0.005 % cream Apply 1 applicator topically daily.      . clobetasol cream (TEMOVATE) 3.14 % Apply 1 application topically daily.      . CRESTOR 20 MG tablet 1 by mouth daily  90 tablet  1  . CYMBALTA 60 MG capsule TAKE 1 CAPSULE ONCE DAILY  90 capsule  1  . diltiazem (CARTIA XT) 240 MG 24 hr capsule Take 1 capsule (240 mg total) by mouth daily.  30 capsule  5  . etanercept (ENBREL) 50 MG/ML injection Inject 50 mg into the skin once a  week.        . Gabapentin, PHN, (GRALISE) 600 MG TABS Take 2 tablets by mouth at bedtime.      Marland Kitchen glimepiride (AMARYL) 4 MG tablet TAKE 1 TABLET DAILY  14 tablet  0  . Insulin Pen Needle (NOVOFINE) 32G X 6 MM MISC 1 needle daily as directed, DX: 250.00  100 each  3  . JANUVIA 100 MG tablet APPOINTMENT DUE, 1 by mouth daily  90 tablet  0  . lisinopril (PRINIVIL,ZESTRIL) 40 MG tablet TAKE 1 TABLET ONCE DAILY  90 tablet  1  . metFORMIN (GLUCOPHAGE) 1000 MG tablet TAKE 1 TABLET TWICE A DAY  60 tablet  0  . metoprolol (LOPRESSOR) 50 MG tablet TAKE ONE-HALF (1/2) TABLET TWICE A DAY  14 tablet  0  . OxyCODONE (OXYCONTIN) 20 mg T12A 12 hr tablet Take 20 mg by mouth 3 (three) times daily.      Marland Kitchen VICTOZA 18 MG/3ML SOLN AT THE START OF THERAPY INJECT 0.6 MG FOR 7-14 DAYS THEN 1.2  MG DAILY THEREAFTER  3 mL  0   No current facility-administered medications on file prior to visit.    Allergies  Allergen Reactions  . Felodipine     edema    Family History  Problem Relation Age of Onset  . Heart disease Mother     MI; stents and on coumadin  . Heart disease Father     MI  . Cancer Father     PANCREATIC; died Jun 02, 2023  . Aneurysm Mother     of brain 4/12  . Dementia Mother   . Peripheral vascular disease Mother     clot to leg, S/P removal  . Colon polyps Mother   DM: mother  BP 108/80  Pulse 117  Temp(Src) 97.8 F (36.6 C) (Oral)  Ht 6' (1.829 m)  Wt 255 lb (115.667 kg)  BMI 34.58 kg/m2  SpO2 95%  Review of Systems He has lost weight, due to his efforts.  Denies blurry vision, headache, chest pain, sob, n/v, excessive diaphoresis, memory loss, hypoglycemia, rhinorrhea, and easy bruising.  He has urinary frequency, leg cramps, and depression.      Objective:   Physical Exam VS: see vs page GEN: no distress HEAD: head: no deformity eyes: no periorbital swelling, no proptosis external nose and ears are normal mouth: no lesion seen NECK: supple, thyroid is not enlarged CHEST WALL: no  deformity LUNGS: clear to auscultation BREASTS:  No gynecomastia CV: reg rate and rhythm, no murmur MUSCULOSKELETAL: muscle bulk and strength are grossly normal.  no obvious joint swelling.  gait is steady, with a cane PULSES:  no carotid bruit NEURO:  cn 2-12 grossly intact.   readily moves all 4's.   SKIN:  Normal texture and temperature.  No rash or suspicious lesion is visible.  i don't see any psoriatic plaques.  NODES:  None palpable at the neck PSYCH: alert, well-oriented.  Does not appear anxious nor depressed.  Lab Results  Component Value Date   HGBA1C 9.7* 05/12/2013      Assessment & Plan:  Type 2 DM: he needs increased rx Sciatica: this limits exercise rx of DM. Economic circumstances: these limit rx options.

## 2013-07-23 NOTE — Telephone Encounter (Signed)
Refill for ventolin sent to [facility] per pt request.

## 2013-07-31 ENCOUNTER — Other Ambulatory Visit: Payer: Self-pay | Admitting: *Deleted

## 2013-07-31 ENCOUNTER — Encounter: Payer: Self-pay | Admitting: Endocrinology

## 2013-07-31 MED ORDER — METFORMIN HCL 1000 MG PO TABS: ORAL_TABLET | ORAL | Status: DC

## 2013-08-22 ENCOUNTER — Other Ambulatory Visit: Payer: Self-pay | Admitting: Endocrinology

## 2013-12-03 ENCOUNTER — Other Ambulatory Visit: Payer: Self-pay | Admitting: Endocrinology

## 2013-12-25 ENCOUNTER — Other Ambulatory Visit: Payer: Self-pay | Admitting: Endocrinology

## 2014-01-01 ENCOUNTER — Other Ambulatory Visit: Payer: Self-pay

## 2014-01-01 MED ORDER — GLIMEPIRIDE 4 MG PO TABS: ORAL_TABLET | ORAL | Status: DC

## 2014-01-22 ENCOUNTER — Emergency Department (HOSPITAL_COMMUNITY)
Admission: EM | Admit: 2014-01-22 | Discharge: 2014-01-22 | Disposition: A | Discharge status: Home / Self Care | Payer: 59 | Attending: Emergency Medicine | Admitting: Emergency Medicine

## 2014-01-22 ENCOUNTER — Encounter (HOSPITAL_COMMUNITY): Payer: Self-pay | Admitting: Emergency Medicine

## 2014-01-22 DIAGNOSIS — M19019 Primary osteoarthritis, unspecified shoulder: Secondary | ICD-10-CM | POA: Insufficient documentation

## 2014-01-22 DIAGNOSIS — IMO0002 Reserved for concepts with insufficient information to code with codable children: Secondary | ICD-10-CM | POA: Insufficient documentation

## 2014-01-22 DIAGNOSIS — T23201A Burn of second degree of right hand, unspecified site, initial encounter: Secondary | ICD-10-CM

## 2014-01-22 DIAGNOSIS — Y9389 Activity, other specified: Secondary | ICD-10-CM | POA: Insufficient documentation

## 2014-01-22 DIAGNOSIS — X12XXXA Contact with other hot fluids, initial encounter: Secondary | ICD-10-CM | POA: Insufficient documentation

## 2014-01-22 DIAGNOSIS — Z872 Personal history of diseases of the skin and subcutaneous tissue: Secondary | ICD-10-CM | POA: Insufficient documentation

## 2014-01-22 DIAGNOSIS — T23231A Burn of second degree of multiple right fingers (nail), not including thumb, initial encounter: Secondary | ICD-10-CM

## 2014-01-22 DIAGNOSIS — Z86718 Personal history of other venous thrombosis and embolism: Secondary | ICD-10-CM | POA: Insufficient documentation

## 2014-01-22 DIAGNOSIS — Z79899 Other long term (current) drug therapy: Secondary | ICD-10-CM | POA: Insufficient documentation

## 2014-01-22 DIAGNOSIS — E785 Hyperlipidemia, unspecified: Secondary | ICD-10-CM | POA: Insufficient documentation

## 2014-01-22 DIAGNOSIS — I10 Essential (primary) hypertension: Secondary | ICD-10-CM | POA: Insufficient documentation

## 2014-01-22 DIAGNOSIS — F17200 Nicotine dependence, unspecified, uncomplicated: Secondary | ICD-10-CM | POA: Insufficient documentation

## 2014-01-22 DIAGNOSIS — Y929 Unspecified place or not applicable: Secondary | ICD-10-CM | POA: Insufficient documentation

## 2014-01-22 DIAGNOSIS — E119 Type 2 diabetes mellitus without complications: Secondary | ICD-10-CM | POA: Insufficient documentation

## 2014-01-22 DIAGNOSIS — J45909 Unspecified asthma, uncomplicated: Secondary | ICD-10-CM | POA: Insufficient documentation

## 2014-01-22 DIAGNOSIS — Z794 Long term (current) use of insulin: Secondary | ICD-10-CM | POA: Insufficient documentation

## 2014-01-22 DIAGNOSIS — Z23 Encounter for immunization: Secondary | ICD-10-CM | POA: Insufficient documentation

## 2014-01-22 DIAGNOSIS — T23099A Burn of unspecified degree of multiple sites of unspecified wrist and hand, initial encounter: Secondary | ICD-10-CM | POA: Insufficient documentation

## 2014-01-22 DIAGNOSIS — Z7982 Long term (current) use of aspirin: Secondary | ICD-10-CM | POA: Insufficient documentation

## 2014-01-22 DIAGNOSIS — T23299A Burn of second degree of multiple sites of unspecified wrist and hand, initial encounter: Secondary | ICD-10-CM | POA: Insufficient documentation

## 2014-01-22 DIAGNOSIS — X131XXA Other contact with steam and other hot vapors, initial encounter: Secondary | ICD-10-CM

## 2014-01-22 MED ORDER — SILVER SULFADIAZINE 1 % EX CREA
TOPICAL_CREAM | Freq: Once | CUTANEOUS | Status: AC
  Administered 2014-01-22: 1 via TOPICAL
  Filled 2014-01-22: qty 85

## 2014-01-22 MED ORDER — SILVER SULFADIAZINE 1 % EX CREA: 1.0000 "application " | TOPICAL_CREAM | Freq: Every day | CUTANEOUS | Status: DC

## 2014-01-22 MED ORDER — TETANUS-DIPHTHERIA TOXOIDS TD 5-2 LFU IM INJ
0.5000 mL | INJECTION | Freq: Once | INTRAMUSCULAR | Status: AC
  Administered 2014-01-22: 0.5 mL via INTRAMUSCULAR
  Filled 2014-01-22: qty 0.5

## 2014-01-22 MED ORDER — OXYCODONE-ACETAMINOPHEN 5-325 MG PO TABS
2.0000 | ORAL_TABLET | Freq: Once | ORAL | Status: AC
  Administered 2014-01-22: 2 via ORAL
  Filled 2014-01-22: qty 2
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
## 2014-01-22 NOTE — Progress Notes (Signed)
[facility] Community Liaison at [place]:  Will be sending patient, upon discharge, information on [facility] [employer] and other resources to help patient establish primary care.

## 2014-01-22 NOTE — Discharge Instructions (Signed)
Burn Care Your skin is a natural barrier to infection. It is the largest organ of your body. Burns damage this natural protection. To help prevent infection, it is very important to follow your caregiver's instructions in the care of your burn. Burns are classified as:  First degree. There is only redness of the skin (erythema). No scarring is expected.  Second degree. There is blistering of the skin. Scarring may occur with deeper burns.  Third degree. All layers of the skin are injured, and scarring is expected. HOME CARE INSTRUCTIONS   Wash your hands well before changing your bandage.  Change your bandage as often as directed by your caregiver.  Remove the old bandage. If the bandage sticks, you may soak it off with cool, clean water.  Cleanse the burn thoroughly but gently with mild soap and water.  Pat the area dry with a clean, dry cloth.  Apply a thin layer of antibacterial cream to the burn.  Apply a clean bandage as instructed by your caregiver.  Keep the bandage as clean and dry as possible.  Elevate the affected area for the first 24 hours, then as instructed by your caregiver.  Only take over-the-counter or prescription medicines for pain, discomfort, or fever as directed by your caregiver. SEEK IMMEDIATE MEDICAL CARE IF:   You develop excessive pain.  You develop redness, tenderness, swelling, or red streaks near the burn.  The burned area develops yellowish-white fluid (pus) or a bad smell.  You have a fever. MAKE SURE YOU:   Understand these instructions.  Will watch your condition.  Will get help right away if you are not doing well or get worse. Document Released: 06/12/2005 Document Revised: 09/04/2011 Document Reviewed: 11/02/2010 Exit[employer] Patient Information 2015 [employer]. This information is not intended to replace advice given to you by your health care provider. Make sure you discuss any questions you have with your health care  provider.  

## 2014-01-23 ENCOUNTER — Telehealth: Payer: Self-pay

## 2014-01-23 NOTE — Telephone Encounter (Signed)
Diabetic Bundle. Called pt to schedule with Dr. Loanne Drilling. At this time pt does not have any insurance. Pt advised that just the ov would be 145$. Pt stated that he could not schedule appointment at this time because of his financial situation. Pt states when he is in a position where he can schedule he will contact our office.

## 2014-02-03 ENCOUNTER — Other Ambulatory Visit: Payer: Self-pay | Admitting: Endocrinology

## 2014-02-10 ENCOUNTER — Telehealth: Payer: Self-pay | Admitting: Internal Medicine

## 2014-02-10 MED ORDER — LISINOPRIL 40 MG PO TABS: ORAL_TABLET | ORAL | Status: DC

## 2014-02-10 NOTE — Telephone Encounter (Signed)
Patient is calling in regards to his lisinopril (PRINIVIL,ZESTRIL) 40 MG. He needs is it refilled and states that he is unable to come in for an OV because he has no money and no insurance. Please advise.

## 2014-03-06 ENCOUNTER — Other Ambulatory Visit: Payer: Self-pay | Admitting: Endocrinology

## 2014-03-12 ENCOUNTER — Other Ambulatory Visit: Payer: Self-pay | Admitting: Endocrinology

## 2014-03-12 ENCOUNTER — Other Ambulatory Visit: Payer: Self-pay | Admitting: Internal Medicine

## 2014-03-12 NOTE — Telephone Encounter (Signed)
#  30 OV before refills

## 2014-03-12 NOTE — Telephone Encounter (Signed)
Sent 30 day to [facility]..../lmb 

## 2014-04-07 ENCOUNTER — Telehealth: Payer: Self-pay | Admitting: Endocrinology

## 2014-04-07 ENCOUNTER — Other Ambulatory Visit: Payer: Self-pay | Admitting: Endocrinology

## 2014-04-07 MED ORDER — GLIMEPIRIDE 4 MG PO TABS: ORAL_TABLET | ORAL | Status: DC

## 2014-04-07 NOTE — Telephone Encounter (Signed)
Pt also needs glimeprizxide called into [facility] please call pt once complete

## 2014-04-13 ENCOUNTER — Other Ambulatory Visit: Payer: Self-pay | Admitting: Internal Medicine

## 2014-05-11 ENCOUNTER — Other Ambulatory Visit: Payer: Self-pay | Admitting: Endocrinology

## 2014-05-12 ENCOUNTER — Encounter: Payer: Self-pay | Admitting: Endocrinology

## 2014-05-12 ENCOUNTER — Ambulatory Visit (INDEPENDENT_AMBULATORY_CARE_PROVIDER_SITE_OTHER): Payer: 59 | Admitting: Endocrinology

## 2014-05-12 VITALS — BP 140/94 | HR 105 | Temp 98.5°F | Ht 72.0 in | Wt 295.0 lb

## 2014-05-12 DIAGNOSIS — E1140 Type 2 diabetes mellitus with diabetic neuropathy, unspecified: Secondary | ICD-10-CM

## 2014-05-12 LAB — HEMOGLOBIN A1C: HEMOGLOBIN A1C: 9.5 % — AB (ref 4.6–6.5)
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
## 2014-05-12 NOTE — Patient Instructions (Addendum)
check your blood sugar once a day (buy a [employer] or [employer] brand meter).  vary the time of day when you check, between before the 3 meals, and at bedtime.  also check if you have symptoms of your blood sugar being too high or too low.  please keep a record of the readings and bring it to your next appointment here. You can write it on any piece of paper.  please call us sooner if your blood sugar goes below 70, or if you have a lot of readings over 200. Please continue the metformin, glimepiride, and pioglitizone.   Please come back for a follow-up appointment in 3 months.  Please see Dr Linna Darner for your blood pressure and other medical conditions.

## 2014-05-13 LAB — BASIC METABOLIC PANEL
BUN: 17 mg/dL (ref 6–23)
CO2: 29 mEq/L (ref 19–32)
CREATININE: 0.7 mg/dL (ref 0.4–1.5)
Calcium: 9.8 mg/dL (ref 8.4–10.5)
Chloride: 102 mEq/L (ref 96–112)
GFR: 134.2 mL/min (ref >60.00)
Glucose, Bld: 277 mg/dL — ABNORMAL HIGH (ref 70–99)
Potassium: 5.5 mEq/L — ABNORMAL HIGH (ref 3.5–5.1)
Sodium: 146 mEq/L — ABNORMAL HIGH (ref 135–145)

## 2014-05-14 ENCOUNTER — Ambulatory Visit: Payer: 59 | Admitting: Internal Medicine

## 2014-05-15 ENCOUNTER — Ambulatory Visit (INDEPENDENT_AMBULATORY_CARE_PROVIDER_SITE_OTHER): Payer: 59 | Admitting: Internal Medicine

## 2014-05-15 ENCOUNTER — Encounter: Payer: Self-pay | Admitting: Internal Medicine

## 2014-05-15 VITALS — BP 140/80 | HR 94 | Temp 98.5°F | Resp 12 | Wt 293.0 lb

## 2014-05-15 DIAGNOSIS — I10 Essential (primary) hypertension: Secondary | ICD-10-CM

## 2014-05-15 MED ORDER — LISINOPRIL 40 MG PO TABS: 40.0000 mg | ORAL_TABLET | Freq: Every day | ORAL | Status: DC

## 2014-05-15 NOTE — Progress Notes (Signed)
Pre visit review using our clinic review tool, if applicable. No additional management support is needed unless otherwise documented below in the visit note. 

## 2014-05-15 NOTE — Patient Instructions (Signed)

## 2014-05-15 NOTE — Progress Notes (Signed)
   Subjective:    Patient ID: Anthony Howell, male    DOB: March 29, 1961, 53 y.o.   MRN: [id number]  HPI   He is here for follow-up of his blood pressure. He checks BP randomly only at the pharmacy. He believes it runs 140/85-90. He took his last lisinopril as of today.  He does restrict salt. He is on low-carb diet. He is not exercising. He smoking 3-4 cig/ day  His diabetes is managed by Dr. Loanne Drilling.   Off Victoza he has  gained 40 pounds.  His most recent A1c was 9.5%. Renal function is normal with creatinine 0.7, BUN 17, GFR 134.2.  He has no active cardiac symptoms at this time.    Review of Systems    Chest pain, palpitations, tachycardia, exertional dyspnea, paroxysmal nocturnal dyspnea, claudication or edema are absent.       Objective:   Physical Exam    Positive or pertinent findings include: Based on CDC guidelines obesity is present. He appears disheveled. He has a full beard and mustache Breath sounds are distant; no increased work of breathing Heart sounds are also distant.  Abdomen is protuberant  Pedal pulses are decreased. He is ambulating with a cane.  General appearance :adequately nourished; in no distress. Eyes: No conjunctival inflammation or scleral icterus is present. Heart:  Normal rate and regular rhythm. S1 and S2 normal without gallop, murmur, click, rub or other extra sounds   Lungs:Chest clear to auscultation; no wheezes, rhonchi,rales ,or rubs present. Abdomen: bowel sounds normal, soft and non-tender without masses, organomegaly or hernias noted.  No guarding or rebound. No flank tenderness to percussion. Vascular : all pulses equal ; no bruits present. Skin:Warm & dry.  Intact without suspicious lesions or rashes ; no jaundice or tenting Lymphatic: No lymphadenopathy is noted about the head, neck, axilla           Assessment & Plan:  #1 hypertension, adequate control with normal renal function  #2 diabetes as per Dr.  Loanne Drilling  Plan :renew ACE inhibitor

## 2014-05-18 ENCOUNTER — Telehealth: Payer: Self-pay | Admitting: Internal Medicine

## 2014-05-18 NOTE — Telephone Encounter (Signed)
emmi emailed °

## 2014-05-19 ENCOUNTER — Telehealth: Payer: Self-pay | Admitting: Internal Medicine

## 2014-05-19 NOTE — Telephone Encounter (Signed)
emmi emailed °

## 2014-06-12 ENCOUNTER — Other Ambulatory Visit: Payer: Self-pay

## 2014-06-12 MED ORDER — METFORMIN HCL 1000 MG PO TABS: 1000.0000 mg | ORAL_TABLET | Freq: Two times a day (BID) | ORAL | Status: DC

## 2014-07-14 ENCOUNTER — Other Ambulatory Visit: Payer: Self-pay | Admitting: Endocrinology

## 2014-08-04 ENCOUNTER — Other Ambulatory Visit: Payer: Self-pay | Admitting: Endocrinology

## 2014-08-17 ENCOUNTER — Other Ambulatory Visit: Payer: Self-pay

## 2014-08-17 MED ORDER — ALBUTEROL SULFATE HFA 108 (90 BASE) MCG/ACT IN AERS: INHALATION_SPRAY | RESPIRATORY_TRACT | Status: DC

## 2014-09-14 ENCOUNTER — Other Ambulatory Visit: Payer: Self-pay | Admitting: Endocrinology

## 2014-09-14 NOTE — Telephone Encounter (Signed)
Refill x 1 Ov is due 

## 2014-09-14 NOTE — Telephone Encounter (Signed)
Please advise if ok to refill rx. Medication is listed under a historical provider.

## 2014-09-14 NOTE — Telephone Encounter (Signed)
Rx sent to pharmacy   

## 2014-10-14 ENCOUNTER — Other Ambulatory Visit: Payer: Self-pay | Admitting: Endocrinology

## 2014-11-11 ENCOUNTER — Other Ambulatory Visit: Payer: Self-pay | Admitting: Endocrinology

## 2014-12-15 ENCOUNTER — Other Ambulatory Visit: Payer: Self-pay | Admitting: Endocrinology

## 2014-12-17 ENCOUNTER — Telehealth: Payer: Self-pay | Admitting: *Deleted

## 2014-12-17 NOTE — Telephone Encounter (Signed)
Instructed patient to go have his a1c checked.

## 2014-12-21 ENCOUNTER — Other Ambulatory Visit: Payer: Self-pay

## 2015-01-15 ENCOUNTER — Other Ambulatory Visit: Payer: Self-pay | Admitting: Endocrinology

## 2015-01-15 NOTE — Telephone Encounter (Signed)
Please advise if ok to refill Rx. Last office visit was 05/12/2014. Thanks!

## 2015-01-15 NOTE — Telephone Encounter (Signed)
Rx sent 

## 2015-01-15 NOTE — Telephone Encounter (Signed)
Please refill x 3 months F/u ov is due

## 2015-02-16 ENCOUNTER — Telehealth: Payer: Self-pay | Admitting: Endocrinology

## 2015-02-16 MED ORDER — METFORMIN HCL 1000 MG PO TABS: ORAL_TABLET | ORAL | Status: DC

## 2015-02-16 NOTE — Telephone Encounter (Signed)
Rx sent and pt notified appointment. Pt advised after this refill we cannot prescribe until he is seen in the office pt. Voiced understanding.

## 2015-02-16 NOTE — Telephone Encounter (Signed)
Pt is requesting a refill on Metformin. Pt's last office visit was 05/12/2014. Pt has been advised his appointment is due and he has declined to do so because he is currently with out insurance. Please advise if we can refill. Thanks!

## 2015-02-16 NOTE — Telephone Encounter (Signed)
Pt is awaiting medicare he was just approved for disability, he does not have insurance, he cannot afford to come for an appt, please advise on what he can do regarding his meds.

## 2015-02-16 NOTE — Telephone Encounter (Signed)
Please refill x 1 month only. We cannot refill further, as pt can't come in for appointments.

## 2015-03-18 ENCOUNTER — Other Ambulatory Visit: Payer: Self-pay | Admitting: Endocrinology

## 2015-03-19 ENCOUNTER — Encounter: Payer: Self-pay | Admitting: Endocrinology

## 2015-03-19 ENCOUNTER — Ambulatory Visit (INDEPENDENT_AMBULATORY_CARE_PROVIDER_SITE_OTHER): Payer: Medicare Other | Admitting: Endocrinology

## 2015-03-19 VITALS — BP 140/82 | HR 108 | Temp 98.2°F | Ht 72.0 in | Wt 285.0 lb

## 2015-03-19 DIAGNOSIS — L409 Psoriasis, unspecified: Secondary | ICD-10-CM | POA: Diagnosis not present

## 2015-03-19 DIAGNOSIS — E785 Hyperlipidemia, unspecified: Secondary | ICD-10-CM | POA: Diagnosis not present

## 2015-03-19 DIAGNOSIS — M549 Dorsalgia, unspecified: Secondary | ICD-10-CM

## 2015-03-19 DIAGNOSIS — I10 Essential (primary) hypertension: Secondary | ICD-10-CM

## 2015-03-19 DIAGNOSIS — E1140 Type 2 diabetes mellitus with diabetic neuropathy, unspecified: Secondary | ICD-10-CM

## 2015-03-19 LAB — POCT GLYCOSYLATED HEMOGLOBIN (HGB A1C): HEMOGLOBIN A1C: 13.3

## 2015-03-19 LAB — TSH: TSH: 1.65 u[IU]/mL (ref 0.35–4.50)

## 2015-03-19 LAB — LIPID PANEL
CHOL/HDL RATIO: 7
CHOLESTEROL: 362 mg/dL — AB (ref 0–200)
HDL: 50.5 mg/dL (ref >39.00)
Triglycerides: 907 mg/dL — ABNORMAL HIGH (ref 0.0–149.0)

## 2015-03-19 LAB — BASIC METABOLIC PANEL
BUN: 14 mg/dL (ref 6–23)
CHLORIDE: 93 meq/L — AB (ref 96–112)
CO2: 30 mEq/L (ref 19–32)
Calcium: 9.7 mg/dL (ref 8.4–10.5)
Creatinine, Ser: 0.71 mg/dL (ref 0.40–1.50)
GFR: 122.96 mL/min (ref >60.00)
GLUCOSE: 368 mg/dL — AB (ref 70–99)
POTASSIUM: 4.8 meq/L (ref 3.5–5.1)
Sodium: 133 mEq/L — ABNORMAL LOW (ref 135–145)

## 2015-03-19 LAB — LDL CHOLESTEROL, DIRECT: Direct LDL: 209 mg/dL

## 2015-03-19 MED ORDER — INSULIN NPH (HUMAN) (ISOPHANE) 100 UNIT/ML ~~LOC~~ SUSP: 30.0000 [IU] | SUBCUTANEOUS | Status: DC

## 2015-03-19 MED ORDER — ROSUVASTATIN CALCIUM 20 MG PO TABS: 20.0000 mg | ORAL_TABLET | Freq: Every day | ORAL | Status: DC

## 2015-03-19 MED ORDER — GLUCOSE BLOOD VI STRP: 1.0000 | ORAL_STRIP | Freq: Two times a day (BID) | Status: AC

## 2015-03-19 NOTE — Patient Instructions (Addendum)
check your blood sugar twice a day.  vary the time of day when you check, between before the 3 meals, and at bedtime.  also check if you have symptoms of your blood sugar being too high or too low.  please keep a record of the readings and bring it to your next appointment here.  You can write it on any piece of paper.  please call us sooner if your blood sugar goes below 70, or if you have a lot of readings over 200. blood tests are requested for you today.  We'll let you know about the results.   i have sent a prescription to your pharmacy, to start insulin.   Here is a meter.  i have sent a prescription to your pharmacy, for strips.  Please come back for a follow-up appointment in 1-2 weeks.  Also please see Dr Linna Darner soon.

## 2015-03-19 NOTE — Progress Notes (Signed)
Subjective:    Patient ID: Anthony Howell, male    DOB: 04-26-1961, 54 y.o.   MRN: [id number]  HPI Pt returns for f/u of diabetes mellitus: DM type: 2 Dx'ed: 1219 Complications: none Therapy: 3 oral meds DKA: never Severe hypoglycemia: never.  Pancreatitis: never Other:  he has never been on insulin; he says lack of health insurance limits his ability to care for himself, or to check cbg's; exercise is limited by health problems Interval history: he has recently gotten medicare.  He takes metformin only.  His med benefit starts next week.   Past Medical History  Diagnosis Date  . Hypertension   . Diabetes mellitus     AODM  . Hyperlipidemia   . Asthma   . Arthritis     right shoulder and knee  . Psoriasis   . DVT (deep venous thrombosis)     peri operatively @ back surgery    Past Surgical History  Procedure Laterality Date  . Tonsillectomy    . Micrdisectomy      complicated by DVT    Social History   Social History  . Marital Status: Single    Spouse Name: N/A  . Number of Children: N/A  . Years of Education: N/A   Occupational History  . Not on file.   Social History Main Topics  . Smoking status: Current Every Day Smoker -- 0.50 packs/day  . Smokeless tobacco: Not on file  . Alcohol Use: Yes     Comment:  very rarely  . Drug Use: No  . Sexual Activity: Not on file   Other Topics Concern  . Not on file   Social History Narrative    Current Outpatient Prescriptions on File Prior to Visit  Medication Sig Dispense Refill  . albuterol (VENTOLIN HFA) 108 (90 BASE) MCG/ACT inhaler INHALE 1 TO 2 PUFFS EVERY 4 TO 6 HOURS AS NEEDED 3 each 5  . aspirin EC 325 MG tablet Take 325 mg by mouth daily.    Marland Kitchen lisinopril (PRINIVIL,ZESTRIL) 40 MG tablet Take 1 tablet (40 mg total) by mouth daily. 90 tablet 3  . OxyCODONE (OXYCONTIN) 20 mg T12A 12 hr tablet Take 20 mg by mouth 3 (three) times daily.     No current facility-administered medications on file prior to  visit.    Allergies  Allergen Reactions  . Felodipine     edema    Family History  Problem Relation Age of Onset  . Heart disease Mother     MI; stents and on coumadin  . Heart disease Father     MI  . Cancer Father     PANCREATIC; died May 09, 2023  . Aneurysm Mother     of brain 4/12  . Dementia Mother   . Peripheral vascular disease Mother     clot to leg, S/P removal  . Colon polyps Mother     BP 140/82 mmHg  Pulse 108  Temp(Src) 98.2 F (36.8 C) (Oral)  Ht 6' (1.829 m)  Wt 285 lb (129.275 kg)  BMI 38.64 kg/m2  SpO2 94%  Review of Systems He has lost weight.  He denies polyuria.      Objective:   Physical Exam VITAL SIGNS:  See vs page. GENERAL: no distress. NECK: There is no palpable thyroid enlargement.  No thyroid nodule is palpable.   NODES: No palpable lymphadenopathy at the anterior neck.  LUNGS:  Clear to auscultation HEART:  Regular rate and rhythm without murmurs noted. Normal  S1,S2.   Pulses: dorsalis pedis intact bilat.   MSK: no deformity of the feet. CV: trace bilat leg edema. Skin:  no ulcer on the feet.  normal color and temp on the feet, but the skin is dry. Neuro: sensation is intact to touch on the feet. Ext: There is bilateral onychomycosis of the toenails.    A1c=13.3%  i personally reviewed electrocardiogram tracing (today): Indication: DM Impression: Sinus  Tachycardia  - frequent PAC. Left axis -anterior fascicular block.   -Left atrial enlargement.  Nonspecific T-abnormality.   Lab Results  Component Value Date   CHOL 362* 03/19/2015   HDL 50.50 03/19/2015   LDLDIRECT 209.0 03/19/2015   TRIG * 03/19/2015    907.0 Triglyceride is over 400; calculations on Lipids are invalid.   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   Psoriasis: this increases the possibility he is developing type 1 DM. Dyslipidemia: worse: i have sent a prescription to your  pharmacy, for this. HTN: possibly situational: he should have recheck with Dr Linna Darner.   Back pain: this limits exercise: i told pt to do his best.  Patient is advised the following: Patient Instructions  check your blood sugar twice a day.  vary the time of day when you check, between before the 3 meals, and at bedtime.  also check if you have symptoms of your blood sugar being too high or too low.  please keep a record of the readings and bring it to your next appointment here.  You can write it on any piece of paper.  please call us sooner if your blood sugar goes below 70, or if you have a lot of readings over 200. blood tests are requested for you today.  We'll let you know about the results.   i have sent a prescription to your pharmacy, to start insulin.   Here is a meter.  i have sent a prescription to your pharmacy, for strips.  Please come back for a follow-up appointment in 1-2 weeks.  Also please see Dr Linna Darner soon.

## 2015-03-22 ENCOUNTER — Other Ambulatory Visit: Payer: Self-pay

## 2015-03-22 MED ORDER — ONETOUCH DELICA LANCETS FINE MISC: Status: AC

## 2015-03-22 MED ORDER — "INSULIN SYRINGE 30G X 5/16"" 1 ML MISC": Status: DC

## 2015-03-23 DIAGNOSIS — G4700 Insomnia, unspecified: Secondary | ICD-10-CM | POA: Diagnosis not present

## 2015-03-23 DIAGNOSIS — M961 Postlaminectomy syndrome, not elsewhere classified: Secondary | ICD-10-CM | POA: Diagnosis not present

## 2015-03-23 DIAGNOSIS — M47817 Spondylosis without myelopathy or radiculopathy, lumbosacral region: Secondary | ICD-10-CM | POA: Diagnosis not present

## 2015-03-23 DIAGNOSIS — G894 Chronic pain syndrome: Secondary | ICD-10-CM | POA: Diagnosis not present

## 2015-04-01 ENCOUNTER — Ambulatory Visit: Payer: Medicare Other | Admitting: Endocrinology

## 2015-04-06 ENCOUNTER — Encounter: Payer: Self-pay | Admitting: Endocrinology

## 2015-04-06 ENCOUNTER — Ambulatory Visit (INDEPENDENT_AMBULATORY_CARE_PROVIDER_SITE_OTHER): Payer: Medicare Other | Admitting: Endocrinology

## 2015-04-06 VITALS — BP 144/84 | HR 106 | Temp 97.9°F | Ht 72.0 in | Wt 287.0 lb

## 2015-04-06 DIAGNOSIS — E1140 Type 2 diabetes mellitus with diabetic neuropathy, unspecified: Secondary | ICD-10-CM

## 2015-04-06 DIAGNOSIS — Z794 Long term (current) use of insulin: Secondary | ICD-10-CM

## 2015-04-06 MED ORDER — LOSARTAN POTASSIUM-HCTZ 50-12.5 MG PO TABS: 1.0000 | ORAL_TABLET | Freq: Every day | ORAL | Status: DC

## 2015-04-06 NOTE — Progress Notes (Signed)
Subjective:    Patient ID: Anthony Howell, male    DOB: Mar 09, 1961, 54 y.o.   MRN: [id number]  HPI  The state of at least three ongoing medical problems is addressed today, with interval history of each noted here: Pt returns for f/u of diabetes mellitus: DM type: 2 Dx'ed: 4680 Complications: none Therapy: insulin since mid-2016 DKA: never Severe hypoglycemia: never.  Pancreatitis: never Other:  exercise is limited by health problems.  Interval history: no cbg record, but states cbg's vary from 90-200's.  It is in general higher as the day goes on.  pt states he feels better in general recently. HTN: he takes lisinopril as rx'ed.  Denies cough.  Obesity: he denies weight change Past Medical History  Diagnosis Date  . Hypertension   . Diabetes mellitus     AODM  . Hyperlipidemia   . Asthma   . Arthritis     right shoulder and knee  . Psoriasis   . DVT (deep venous thrombosis) ([place])     peri operatively @ back surgery    Past Surgical History  Procedure Laterality Date  . Tonsillectomy    . Micrdisectomy      complicated by DVT    Social History   Social History  . Marital Status: Single    Spouse Name: N/A  . Number of Children: N/A  . Years of Education: N/A   Occupational History  . Not on file.   Social History Main Topics  . Smoking status: Current Every Day Smoker -- 0.50 packs/day  . Smokeless tobacco: Not on file  . Alcohol Use: Yes     Comment:  very rarely  . Drug Use: No  . Sexual Activity: Not on file   Other Topics Concern  . Not on file   Social History Narrative    Current Outpatient Prescriptions on File Prior to Visit  Medication Sig Dispense Refill  . albuterol (VENTOLIN HFA) 108 (90 BASE) MCG/ACT inhaler INHALE 1 TO 2 PUFFS EVERY 4 TO 6 HOURS AS NEEDED 3 each 5  . aspirin EC 325 MG tablet Take 325 mg by mouth daily.    Marland Kitchen glucose blood (ONETOUCH VERIO) test strip 1 each by Other route 2 (two) times daily. And lancets 2/day 100 each  12  . insulin NPH Human (HUMULIN N) 100 UNIT/ML injection Inject 0.3 mLs (30 Units total) into the skin every morning. And 100-unit syringes 1/day 10 mL 11  . Insulin Syringe-Needle U-100 (INSULIN SYRINGE 1CC/30GX5/16") 30G X 5/16" 1 ML MISC Use to inject insulin 1 timer per day. 100 each 2  . ONETOUCH DELICA LANCETS FINE MISC Use to check blood sugar 2 times per day 200 each 2  . OxyCODONE (OXYCONTIN) 20 mg T12A 12 hr tablet Take 20 mg by mouth 3 (three) times daily.    . rosuvastatin (CRESTOR) 20 MG tablet Take 1 tablet (20 mg total) by mouth daily. 30 tablet 11   No current facility-administered medications on file prior to visit.    Allergies  Allergen Reactions  . Felodipine     edema    Family History  Problem Relation Age of Onset  . Heart disease Mother     MI; stents and on coumadin  . Heart disease Father     MI  . Cancer Father     PANCREATIC; died May 19, 2023  . Aneurysm Mother     of brain 4/12  . Dementia Mother   . Peripheral vascular disease Mother  clot to leg, S/P removal  . Colon polyps Mother     BP 144/84 mmHg  Pulse 106  Temp(Src) 97.9 F (36.6 C) (Oral)  Ht 6' (1.829 m)  Wt 287 lb (130.182 kg)  BMI 38.92 kg/m2  SpO2 92%    Review of Systems He denies hypoglycemia.   No change in chronic wheezing.     Objective:   Physical Exam VITAL SIGNS:  See vs page GENERAL: no distress SKIN:  Insulin injection sites at the anterior abdomen are normal   Lab Results  Component Value Date   HGBA1C 13.3 03/19/2015      Assessment & Plan:  HTH: he needs increased rx Cough/wheezing, possibly due to ACEI.  DM: control is improved, but based on the pattern of his cbg's, he may need 70/30. Obesity: persistent.  Patient is advised the following: Patient Instructions  check your blood sugar twice a day.  vary the time of day when you check, between before the 3 meals, and at bedtime.  also check if you have symptoms of your blood sugar being too high or  too low.  please keep a record of the readings and bring it to your next appointment here.  You can write it on any piece of paper.  please call us sooner if your blood sugar goes below 70, or if you have a lot of readings over 200.  Please come back for a follow-up appointment in 6 weeks. Please continue the same insulin.   Please see Dr Linna Darner soon.  Then I'll take care of just the diabetes.  i have sent a prescription to your pharmacy, for a new blood pressure pill.  You can finish what you have first.   At our office, we are fortunate to have two specialists who are happy to help you:   Anthony Reader, RN, CDE, is a diabetes educator and pump trainer.  She is here on Monday mornings, and all day Tuesday and Wednesday.  She is can help you with low blood sugar avoidance and treatment, injecting insulin, sick day management, and others.   Anthony Howell, RD is our dietician.  She is here all day Thursday and Friday.  She can advise you about a healthy diet.  She can also help you about a variety of special diabetes situations, such as shift work, Actor, gluten-free, diet for kidney patients, traveling with diabetes, and help for those who need to gain weight.   Please consider having weight loss surgery.  It is good for your health.  Here is some information about it.  If you decide to consider further, please call the phone number in the papers, and register for a free informational meeting

## 2015-04-06 NOTE — Patient Instructions (Addendum)
check your blood sugar twice a day.  vary the time of day when you check, between before the 3 meals, and at bedtime.  also check if you have symptoms of your blood sugar being too high or too low.  please keep a record of the readings and bring it to your next appointment here.  You can write it on any piece of paper.  please call us sooner if your blood sugar goes below 70, or if you have a lot of readings over 200.  Please come back for a follow-up appointment in 6 weeks. Please continue the same insulin.   Please see Dr Linna Darner soon.  Then I'll take care of just the diabetes.  i have sent a prescription to your pharmacy, for a new blood pressure pill.  You can finish what you have first.   At our office, we are fortunate to have two specialists who are happy to help you:   Leonia Reader, RN, CDE, is a diabetes educator and pump trainer.  She is here on Monday mornings, and all day Tuesday and Wednesday.  She is can help you with low blood sugar avoidance and treatment, injecting insulin, sick day management, and others.   Antonieta Iba, RD is our dietician.  She is here all day Thursday and Friday.  She can advise you about a healthy diet.  She can also help you about a variety of special diabetes situations, such as shift work, Actor, gluten-free, diet for kidney patients, traveling with diabetes, and help for those who need to gain weight.   Please consider having weight loss surgery.  It is good for your health.  Here is some information about it.  If you decide to consider further, please call the phone number in the papers, and register for a free informational meeting

## 2015-04-20 ENCOUNTER — Other Ambulatory Visit: Payer: Self-pay | Admitting: Internal Medicine

## 2015-04-20 NOTE — Telephone Encounter (Signed)
#  15 Last seen 11/15; needs OV

## 2015-04-20 NOTE — Telephone Encounter (Signed)
Not on pts current med list. Last OV 11/15. Please advise.

## 2015-04-29 DIAGNOSIS — M47817 Spondylosis without myelopathy or radiculopathy, lumbosacral region: Secondary | ICD-10-CM | POA: Diagnosis not present

## 2015-04-29 DIAGNOSIS — G894 Chronic pain syndrome: Secondary | ICD-10-CM | POA: Diagnosis not present

## 2015-04-29 DIAGNOSIS — G4700 Insomnia, unspecified: Secondary | ICD-10-CM | POA: Diagnosis not present

## 2015-04-29 DIAGNOSIS — M961 Postlaminectomy syndrome, not elsewhere classified: Secondary | ICD-10-CM | POA: Diagnosis not present

## 2015-05-18 ENCOUNTER — Other Ambulatory Visit: Payer: Self-pay | Admitting: Endocrinology

## 2015-06-02 ENCOUNTER — Telehealth: Payer: Self-pay | Admitting: Internal Medicine

## 2015-06-02 MED ORDER — LISINOPRIL 40 MG PO TABS: 40.0000 mg | ORAL_TABLET | Freq: Every day | ORAL | Status: DC

## 2015-06-02 NOTE — Telephone Encounter (Signed)
Pt is needing another month supply of lisinopril (PRINIVIL,ZESTRIL) 40 MG tablet [id number]  His medicare doesn't kick in till January so he is unable to come in for an appt. Till then. I did get him scheduled with Dr. Quay Burow the first week of January. [facility] [address]

## 2015-06-02 NOTE — Telephone Encounter (Signed)
Notified pt 30 supply has been sent to [facility]...Anthony Howell

## 2015-06-03 MED ORDER — LISINOPRIL 40 MG PO TABS: 40.0000 mg | ORAL_TABLET | Freq: Every day | ORAL | Status: DC

## 2015-06-03 NOTE — Addendum Note (Signed)
Addended by: Earnstine Regal on: 06/03/2015 04:20 PM   Modules accepted: Orders

## 2015-06-03 NOTE — Telephone Encounter (Signed)
Resent rx for # 30 first script was sent for #15 pill needed enough until he see Dr. Quay Burow on 07/03/15...Anthony Howell

## 2015-06-24 DIAGNOSIS — M961 Postlaminectomy syndrome, not elsewhere classified: Secondary | ICD-10-CM | POA: Diagnosis not present

## 2015-06-24 DIAGNOSIS — Z79891 Long term (current) use of opiate analgesic: Secondary | ICD-10-CM | POA: Diagnosis not present

## 2015-06-24 DIAGNOSIS — G4700 Insomnia, unspecified: Secondary | ICD-10-CM | POA: Diagnosis not present

## 2015-06-24 DIAGNOSIS — M47817 Spondylosis without myelopathy or radiculopathy, lumbosacral region: Secondary | ICD-10-CM | POA: Diagnosis not present

## 2015-06-24 DIAGNOSIS — G894 Chronic pain syndrome: Secondary | ICD-10-CM | POA: Diagnosis not present

## 2015-07-01 ENCOUNTER — Encounter: Payer: Self-pay | Admitting: Internal Medicine

## 2015-07-01 ENCOUNTER — Ambulatory Visit (INDEPENDENT_AMBULATORY_CARE_PROVIDER_SITE_OTHER)
Admission: RE | Admit: 2015-07-01 | Discharge: 2015-07-01 | Disposition: A | Discharge status: Home / Self Care | Payer: Medicare Other | Source: Ambulatory Visit | Attending: Internal Medicine | Admitting: Internal Medicine

## 2015-07-01 ENCOUNTER — Ambulatory Visit (INDEPENDENT_AMBULATORY_CARE_PROVIDER_SITE_OTHER): Payer: Medicare Other | Admitting: Internal Medicine

## 2015-07-01 VITALS — BP 154/104 | HR 104 | Temp 98.3°F | Resp 18 | Wt 280.0 lb

## 2015-07-01 DIAGNOSIS — E1140 Type 2 diabetes mellitus with diabetic neuropathy, unspecified: Secondary | ICD-10-CM | POA: Diagnosis not present

## 2015-07-01 DIAGNOSIS — Z794 Long term (current) use of insulin: Secondary | ICD-10-CM

## 2015-07-01 DIAGNOSIS — I10 Essential (primary) hypertension: Secondary | ICD-10-CM | POA: Diagnosis not present

## 2015-07-01 DIAGNOSIS — R0781 Pleurodynia: Secondary | ICD-10-CM

## 2015-07-01 DIAGNOSIS — E785 Hyperlipidemia, unspecified: Secondary | ICD-10-CM

## 2015-07-01 DIAGNOSIS — Z139 Encounter for screening, unspecified: Secondary | ICD-10-CM

## 2015-07-01 DIAGNOSIS — M5416 Radiculopathy, lumbar region: Secondary | ICD-10-CM | POA: Insufficient documentation

## 2015-07-01 DIAGNOSIS — F32A Depression, unspecified: Secondary | ICD-10-CM

## 2015-07-01 DIAGNOSIS — F329 Major depressive disorder, single episode, unspecified: Secondary | ICD-10-CM

## 2015-07-01 DIAGNOSIS — R0602 Shortness of breath: Secondary | ICD-10-CM

## 2015-07-01 DIAGNOSIS — Z86718 Personal history of other venous thrombosis and embolism: Secondary | ICD-10-CM

## 2015-07-01 MED ORDER — LISINOPRIL 40 MG PO TABS: 40.0000 mg | ORAL_TABLET | Freq: Every day | ORAL | Status: DC

## 2015-07-01 MED ORDER — ROSUVASTATIN CALCIUM 20 MG PO TABS: 20.0000 mg | ORAL_TABLET | Freq: Every day | ORAL | Status: DC

## 2015-07-01 MED ORDER — AMLODIPINE BESYLATE 5 MG PO TABS: 5.0000 mg | ORAL_TABLET | Freq: Every day | ORAL | Status: DC

## 2015-07-01 NOTE — Progress Notes (Signed)
Subjective:    Patient ID: Anthony Howell, male    DOB: 20-Feb-1961, 55 y.o.   MRN: [id number]  HPI He is here to establish with a new pcp.   He is here for routine follow-up of his chronic medical problems.  For the past three years he has not had insurance.   He is currently not working and is on disability (chronic back pain).   He has not been able to take some of his medications regularly.  He has been taking the lisinopril, asa, insulin and pain medications.  Sob:  He has been having shortness of breath for about 6 months.  He only has sob with exertion, especially with a slight incline or stairs.  No sob with with flat surface.  He currently smokes and he knows he needs to quit, but wants to get his health in order before he attempts to quit. He smokes less than 1 pack a day. He has been smoking for 16-17 years. He has tried Chantix in the past, but had severe nightmares. He does experience wheezing, but denies chronic cough.  Rib pain;  He has pain on the lateral aspect of his right chest. It started about one week ago.  He thought he hurt a rib, but denies any falls or major injuuries.  It hurts when he breaths in and improves with expiration.  He has tenderness when he pushes in that area.  Lumbar radiculopathy:  He has numbness in the left foot on the plantar surface. He has lower back pain and pain down his left leg. He had surgery for this and his chronic pain is why he is on disability. He is folowing with pain management.  He has pain with any prolonged sitting, standing, walking or laying. He does not sleep well because he has to get up and move around. He is taking his medication daily as prescribed, which pain management prescribes.   Diabetes: He is taking his medication daily as prescribed. He is not compliant with a diabetic diet and is not good with portions. He is not exercising regularly, but does walk his dog-he is not able to walk very fast. He monitors his sugars and they  have been running 140. He checks his feet daily and denies foot lesions. He is not able to do good foot care and would like to see podiatrist. He is not up-to-date with an ophthalmology examination.   Hypertension: He is taking his medication daily. He is not always compliant with a low sodium diet.  He denies chest pain, palpitations, edema,  and regular headaches. He is exercising regularly.  He does not monitor his blood pressure at home.    Hyperlipidemia: He is not taking his medication daily-without insurance he was not able to afford it. He is somewhat compliant with a low fat/cholesterol diet. He is not exercising regularly.   Medications and allergies reviewed with patient and updated if appropriate.  Patient Active Problem List   Diagnosis Date Noted  . Lumbar radiculopathy, chronic, left 07/01/2015  . Diabetes ([place]) 05/12/2013  . Smoker 05/12/2013  . Sleep apnea 02/20/2012  . Chronic pain syndrome 02/20/2012  . ANXIETY STATE, UNSPECIFIED 05/18/2009  . MOOD SWINGS 02/18/2009  . ASTHMA 01/22/2008  . History of DVT of lower extremity, left 09/12/2007  . HYPERLIPIDEMIA 12/07/2006  . Essential hypertension 12/07/2006  . PSORIASIS NEC 12/07/2006  . ADVEF, DRUG/MED/BIOL SUBST, ARTHUS PHENOMENON 11/28/2006    Current Outpatient Prescriptions on File Prior to  Visit  Medication Sig Dispense Refill  . albuterol (VENTOLIN HFA) 108 (90 BASE) MCG/ACT inhaler INHALE 1 TO 2 PUFFS EVERY 4 TO 6 HOURS AS NEEDED 3 each 5  . aspirin EC 325 MG tablet Take 325 mg by mouth daily.    Marland Kitchen glucose blood (ONETOUCH VERIO) test strip 1 each by Other route 2 (two) times daily. And lancets 2/day 100 each 12  . insulin NPH Human (HUMULIN N) 100 UNIT/ML injection Inject 0.3 mLs (30 Units total) into the skin every morning. And 100-unit syringes 1/day 10 mL 11  . Insulin Syringe-Needle U-100 (INSULIN SYRINGE 1CC/30GX5/16") 30G X 5/16" 1 ML MISC Use to inject insulin 1 timer per day. 100 each 2  . ONETOUCH  DELICA LANCETS FINE MISC Use to check blood sugar 2 times per day 200 each 2  . OxyCODONE (OXYCONTIN) 20 mg T12A 12 hr tablet Take 20 mg by mouth 3 (three) times daily.     No current facility-administered medications on file prior to visit.    Past Medical History  Diagnosis Date  . Hypertension   . Diabetes mellitus     AODM  . Hyperlipidemia   . Asthma   . Arthritis     right shoulder and knee  . Psoriasis   . DVT (deep venous thrombosis) ([place])     peri operatively @ back surgery    Past Surgical History  Procedure Laterality Date  . Tonsillectomy    . Micrdisectomy      complicated by DVT    Social History   Social History  . Marital Status: Single    Spouse Name: N/A  . Number of Children: N/A  . Years of Education: N/A   Social History Main Topics  . Smoking status: Current Every Day Smoker -- 0.50 packs/day  . Smokeless tobacco: Never Used  . Alcohol Use: Yes     Comment:  very rarely  . Drug Use: Yes    Special: Marijuana  . Sexual Activity: Not Asked   Other Topics Concern  . None   Social History Narrative   Exercise: walks dog    Family History  Problem Relation Age of Onset  . Heart disease Mother     MI; stents and on coumadin  . Aneurysm Mother     of brain 4/12  . Dementia Mother   . Peripheral vascular disease Mother     clot to leg, S/P removal  . Colon polyps Mother   . Heart disease Father     MI  . Cancer Father     PANCREATIC; died 2023/05/29  . Bipolar disorder Brother   . Other Brother     valve replacement  . Epilepsy Brother     Review of Systems  Constitutional: Negative for fever, chills, appetite change, fatigue and unexpected weight change.  Respiratory: Positive for shortness of breath and wheezing. Negative for cough.   Cardiovascular: Negative for chest pain, palpitations and leg swelling.  Gastrointestinal: Negative for nausea, abdominal pain, diarrhea, constipation and blood in stool.       No GERD    Genitourinary: Positive for frequency. Negative for dysuria and difficulty urinating.  Musculoskeletal: Positive for back pain (with left leg pain).  Neurological: Positive for weakness (left leg, chronic) and numbness (left foot). Negative for dizziness, light-headedness and headaches.  Psychiatric/Behavioral: Positive for dysphoric mood (regarding parents death).       Objective:   Filed Vitals:   07/01/15 1101  BP: 154/104  Pulse:  104  Temp: 98.3 F (36.8 C)  Resp: 18   Filed Weights   07/01/15 1101  Weight: 280 lb (127.007 kg)   Body mass index is 37.97 kg/(m^2).   Physical Exam  Constitutional: He appears well-developed and well-nourished. No distress.  HENT:  Head: Normocephalic and atraumatic.  Eyes: Conjunctivae are normal.  Neck: No tracheal deviation present. No thyromegaly present.  Cardiovascular: Normal rate and regular rhythm.   Heart sounds distant  Pulmonary/Chest: Effort normal. No respiratory distress (No respiratory distress at rest, but appear short of breath with getting up to the exam table). He has no wheezes. He has no rales. He exhibits tenderness (Right lateral rib tenderness with palpation, no obvious deformities).  Diffusely decreased breath sounds  Musculoskeletal: He exhibits no edema.  Lymphadenopathy:    He has no cervical adenopathy.  Psychiatric:  Depressed when discussing the death of his parents-he feels comfortable speaking with a psychiatrist.        Assessment & Plan:   His EKG is slightly abnormal, but no major changes since September-his last EKG Given his shortness of breath I have ordered a stress test I have advised him that the shortness of breath worsens or he experiences chest pain, lightheadedness palpitations he should go to the emergency room for further evaluation  Shortness of breath, right-sided rib pain Related x-ray and chest x-ray today Discussed that shortness of breath may be lung or cardiac related Stress  test ordered to rule out cardiac ischemia Stressed smoking cessation He likely has COPD and has a history of asthma and that may be contributing to the shortness of breath Has an albuterol inhaler We will consider Spiriva or similar inhaler he has insurance once we get the results of the x-rays  Depression He does feel somewhat depressed, especially regarding the death of his parents line will refer to psychiatry at his request Continue current dose of Cymbalta  See problem list for assessment and plan

## 2015-07-01 NOTE — Patient Instructions (Signed)
  We have reviewed your prior records including labs and tests today.  Test(s) ordered today. Get the blood work done when you can.  Your results will be released to [facility] (or called to you) after review, usually within 72hours after test completion. If any changes need to be made, you will be notified at that same time.  All other Health Maintenance issues reviewed.   No immunizations administered today.   An EKG was done.   Medications reviewed and updated.   Add amlodipine 5 mg daily.  Restart crestor for your cholesterol.    Your prescription(s) have been printed and given to you. Please take as directed and contact our office if you believe you are having problem(s) with the medication(s).  A referral was ordered for GI for a colonoscopy  A stress test for ordered - we will call you to schedule that.                           Please schedule followup in 4 weeks

## 2015-07-01 NOTE — Assessment & Plan Note (Signed)
Blood pressure not controlled Low-sodium diet Regular exercise Work on weight loss Continue lisinopril 40 mg daily Start amlodipine 5 mg daily Follow-up in one month

## 2015-07-01 NOTE — Assessment & Plan Note (Signed)
Restart Crestor-explained that this is now generic. If it is too expensive I will prescribe atorvastatin Stressed weight loss and healthy diet

## 2015-07-01 NOTE — Assessment & Plan Note (Signed)
Following with Dr. Loanne Drilling Sugars sound fairly controlled at home, but last A1c 3 months ago was very elevated Recheck A1c Discussed portion control and weight loss Medication management per Dr. Loanne Drilling

## 2015-07-01 NOTE — Progress Notes (Signed)
Pre visit review using our clinic review tool, if applicable. No additional management support is needed unless otherwise documented below in the visit note. 

## 2015-07-07 ENCOUNTER — Other Ambulatory Visit: Payer: Self-pay

## 2015-07-07 DIAGNOSIS — E1140 Type 2 diabetes mellitus with diabetic neuropathy, unspecified: Secondary | ICD-10-CM

## 2015-07-07 MED ORDER — INSULIN NPH (HUMAN) (ISOPHANE) 100 UNIT/ML ~~LOC~~ SUSP: 30.0000 [IU] | SUBCUTANEOUS | Status: DC

## 2015-07-07 MED ORDER — "INSULIN SYRINGE 30G X 5/16"" 1 ML MISC": Status: AC

## 2015-07-29 ENCOUNTER — Ambulatory Visit: Payer: Medicare Other | Admitting: Internal Medicine

## 2015-07-30 ENCOUNTER — Other Ambulatory Visit: Payer: Self-pay | Admitting: Internal Medicine

## 2015-08-05 ENCOUNTER — Ambulatory Visit (INDEPENDENT_AMBULATORY_CARE_PROVIDER_SITE_OTHER): Payer: Medicare Other | Admitting: Internal Medicine

## 2015-08-05 ENCOUNTER — Other Ambulatory Visit (INDEPENDENT_AMBULATORY_CARE_PROVIDER_SITE_OTHER): Payer: Medicare Other

## 2015-08-05 ENCOUNTER — Encounter: Payer: Self-pay | Admitting: Internal Medicine

## 2015-08-05 VITALS — BP 158/96 | HR 104 | Temp 98.4°F | Resp 18 | Wt 269.0 lb

## 2015-08-05 DIAGNOSIS — E785 Hyperlipidemia, unspecified: Secondary | ICD-10-CM | POA: Diagnosis not present

## 2015-08-05 DIAGNOSIS — I10 Essential (primary) hypertension: Secondary | ICD-10-CM

## 2015-08-05 DIAGNOSIS — R0602 Shortness of breath: Secondary | ICD-10-CM

## 2015-08-05 DIAGNOSIS — E1140 Type 2 diabetes mellitus with diabetic neuropathy, unspecified: Secondary | ICD-10-CM

## 2015-08-05 DIAGNOSIS — Z23 Encounter for immunization: Secondary | ICD-10-CM

## 2015-08-05 DIAGNOSIS — Z794 Long term (current) use of insulin: Secondary | ICD-10-CM

## 2015-08-05 DIAGNOSIS — R0600 Dyspnea, unspecified: Secondary | ICD-10-CM

## 2015-08-05 LAB — COMPREHENSIVE METABOLIC PANEL
ALBUMIN: 4 g/dL (ref 3.5–5.2)
ALT: 13 U/L (ref 0–53)
AST: 12 U/L (ref 0–37)
Alkaline Phosphatase: 76 U/L (ref 39–117)
BUN: 13 mg/dL (ref 6–23)
CHLORIDE: 92 meq/L — AB (ref 96–112)
CO2: 31 mEq/L (ref 19–32)
Calcium: 9.7 mg/dL (ref 8.4–10.5)
Creatinine, Ser: 0.7 mg/dL (ref 0.40–1.50)
GFR: 124.81 mL/min (ref >60.00)
Glucose, Bld: 448 mg/dL — ABNORMAL HIGH (ref 70–99)
POTASSIUM: 4.8 meq/L (ref 3.5–5.1)
SODIUM: 131 meq/L — AB (ref 135–145)
Total Bilirubin: 0.4 mg/dL (ref 0.2–1.2)
Total Protein: 7 g/dL (ref 6.0–8.3)

## 2015-08-05 LAB — CBC WITH DIFFERENTIAL/PLATELET
Basophils Absolute: 0.1 10*3/uL (ref 0.0–0.1)
Basophils Relative: 0.5 % (ref 0.0–3.0)
EOS PCT: 2.7 % (ref 0.0–5.0)
Eosinophils Absolute: 0.3 10*3/uL (ref 0.0–0.7)
HEMATOCRIT: 51.5 % (ref 39.0–52.0)
Hemoglobin: 17.6 g/dL — ABNORMAL HIGH (ref 13.0–17.0)
LYMPHS ABS: 2.6 10*3/uL (ref 0.7–4.0)
LYMPHS PCT: 25.7 % (ref 12.0–46.0)
MCHC: 34.2 g/dL (ref 30.0–36.0)
MCV: 91.7 fl (ref 78.0–100.0)
MONOS PCT: 8.9 % (ref 3.0–12.0)
Monocytes Absolute: 0.9 10*3/uL (ref 0.1–1.0)
Neutro Abs: 6.2 10*3/uL (ref 1.4–7.7)
Neutrophils Relative %: 62.2 % (ref 43.0–77.0)
PLATELETS: 253 10*3/uL (ref 150.0–400.0)
RBC: 5.62 Mil/uL (ref 4.22–5.81)
RDW: 12.5 % (ref 11.5–15.5)
WBC: 9.9 10*3/uL (ref 4.0–10.5)

## 2015-08-05 LAB — LIPID PANEL
CHOL/HDL RATIO: 4
Cholesterol: 195 mg/dL (ref 0–200)
HDL: 48.6 mg/dL (ref >39.00)
NonHDL: 146.75
Triglycerides: 376 mg/dL — ABNORMAL HIGH (ref 0.0–149.0)
VLDL: 75.2 mg/dL — AB (ref 0.0–40.0)

## 2015-08-05 LAB — HEMOGLOBIN A1C

## 2015-08-05 LAB — LDL CHOLESTEROL, DIRECT: Direct LDL: 98 mg/dL

## 2015-08-05 LAB — TSH: TSH: 2.16 u[IU]/mL (ref 0.35–4.50)

## 2015-08-05 MED ORDER — METOPROLOL SUCCINATE ER 25 MG PO TB24: 25.0000 mg | ORAL_TABLET | Freq: Every day | ORAL | Status: DC

## 2015-08-05 MED ORDER — BD ASSURE BPM/AUTO ARM CUFF MISC: Status: AC

## 2015-08-05 NOTE — Progress Notes (Signed)
Pre visit review using our clinic review tool, if applicable. No additional management support is needed unless otherwise documented below in the visit note. 

## 2015-08-05 NOTE — Patient Instructions (Addendum)
  We have reviewed your prior records including labs and tests today.  Test(s) ordered today. Your results will be released to [facility] (or called to you) after review, usually within 72hours after test completion. If any changes need to be made, you will be notified at that same time.  All other Health Maintenance issues reviewed.   All recommended immunizations and age-appropriate screenings are up-to-date.  Pneumonia vaccine administered today.   Medications reviewed and updated.  Changes include adding another blood pressure medication called metoprolol.   Your prescription(s) have been submitted to your pharmacy. Please take as directed and contact our office if you believe you are having problem(s) with the medication(s).   Please schedule followup in 1 month

## 2015-08-05 NOTE — Progress Notes (Signed)
Subjective:    Patient ID: Anthony Howell, male    DOB: [birth year], 55 y.o.   MRN: [id number]  HPI He is here for follow up.   SOB: He is still smoking.  He is sob with inclines and stairs.  He wheezes, but denies a cough.   Diabetes: He is taking his medication daily as prescribed. He is fairly compliant with a diabetic diet and he has lost weight. He is exercising regularly - walking dog. He monitors his sugars and they have been running 170-230. He is following with Dr. Loanne Drilling.    Hypertension: He is taking his medication daily. He is not always compliant with a low sodium diet.  He still feels sob and tired.  He denies chest pain, palpitations, edema and regular headaches. He is exercising regularly - walking and he has increased it.  He does not monitor his blood pressure at home.    Hyperlipidemia: He is taking his medication daily. He is compliant with a low fat/cholesterol diet. He is exercising regularly -- walking his dog and when they go to the dog park he walks the perimeter. He denies myalgias.     Medications and allergies reviewed with patient and updated if appropriate.  Patient Active Problem List   Diagnosis Date Noted  . Lumbar radiculopathy, chronic, left 07/01/2015  . Diabetes ([place]) 05/12/2013  . Smoker 05/12/2013  . Sleep apnea 02/20/2012  . Chronic pain syndrome 02/20/2012  . ANXIETY STATE, UNSPECIFIED 05/18/2009  . MOOD SWINGS 02/18/2009  . ASTHMA 01/22/2008  . History of DVT of lower extremity, left 09/12/2007  . Hyperlipidemia 12/07/2006  . Essential hypertension 12/07/2006  . PSORIASIS NEC 12/07/2006  . ADVEF, DRUG/MED/BIOL SUBST, ARTHUS PHENOMENON 11/28/2006    Current Outpatient Prescriptions on File Prior to Visit  Medication Sig Dispense Refill  . amLODipine (NORVASC) 5 MG tablet Take 1 tablet (5 mg total) by mouth daily. 90 tablet 3  . aspirin EC 325 MG tablet Take 325 mg by mouth daily.    . DULoxetine (CYMBALTA) 60 MG capsule Take 60 mg  by mouth daily.    Marland Kitchen glucose blood (ONETOUCH VERIO) test strip 1 each by Other route 2 (two) times daily. And lancets 2/day 100 each 12  . insulin NPH Human (HUMULIN N) 100 UNIT/ML injection Inject 0.3 mLs (30 Units total) into the skin every morning. And 100-unit syringes 1/day 10 mL 11  . Insulin Syringe-Needle U-100 (INSULIN SYRINGE 1CC/30GX5/16") 30G X 5/16" 1 ML MISC Use to inject insulin 1 timer per day. 100 each 2  . lisinopril (PRINIVIL,ZESTRIL) 40 MG tablet Take 1 tablet (40 mg total) by mouth daily. --- Needs office visit for further refills. 30 tablet 5  . ONETOUCH DELICA LANCETS FINE MISC Use to check blood sugar 2 times per day 200 each 2  . OxyCODONE (OXYCONTIN) 20 mg T12A 12 hr tablet Take 20 mg by mouth 3 (three) times daily.    . rosuvastatin (CRESTOR) 20 MG tablet Take 1 tablet (20 mg total) by mouth daily. 30 tablet 11   No current facility-administered medications on file prior to visit.    Past Medical History  Diagnosis Date  . Hypertension   . Diabetes mellitus     AODM  . Hyperlipidemia   . Asthma   . Arthritis     right shoulder and knee  . Psoriasis   . DVT (deep venous thrombosis) ([place])     peri operatively @ back surgery    Past  Surgical History  Procedure Laterality Date  . Tonsillectomy    . Micrdisectomy      complicated by DVT    Social History   Social History  . Marital Status: Single    Spouse Name: N/A  . Number of Children: N/A  . Years of Education: N/A   Social History Main Topics  . Smoking status: Current Every Day Smoker -- 0.50 packs/day  . Smokeless tobacco: Never Used  . Alcohol Use: 0.0 oz/week    0 Standard drinks or equivalent per week     Comment:  very rarely  . Drug Use: Yes    Special: Marijuana  . Sexual Activity: Not Asked   Other Topics Concern  . None   Social History Narrative   Exercise: walks dog    Family History  Problem Relation Age of Onset  . Heart disease Mother     MI; stents and on coumadin   . Aneurysm Mother     of brain 4/12  . Dementia Mother   . Peripheral vascular disease Mother     clot to leg, S/P removal  . Colon polyps Mother   . Heart disease Father     MI  . Cancer Father     PANCREATIC; died Jun 03, 2023  . Bipolar disorder Brother   . Other Brother     valve replacement  . Epilepsy Brother     Review of Systems  Constitutional: Negative for fever.  Respiratory: Positive for shortness of breath and wheezing. Negative for cough.   Cardiovascular: Negative for chest pain and palpitations.  Musculoskeletal: Negative for myalgias.  Neurological: Negative for dizziness, light-headedness and headaches.       Objective:   Filed Vitals:   08/05/15 0828  BP: 158/96  Pulse: 104  Temp: 98.4 F (36.9 C)  Resp: 18   Filed Weights   08/05/15 0828  Weight: 269 lb (122.018 kg)   Body mass index is 36.48 kg/(m^2).   Physical Exam Constitutional: Appears well-developed and well-nourished. No distress.  Neck: Neck supple. No tracheal deviation present. No thyromegaly present.  No carotid bruit. No cervical adenopathy.   Cardiovascular: Normal rate, regular rhythm and normal heart sounds.   No murmur heard.  trace edema Pulmonary/Chest: Decreased BS diffusely.  Effort normal and breath sounds normal. No respiratory distress. No wheezes.      Assessment & Plan:   See Problem List for Assessment and Plan of chronic medical problems.   pneumonia vaccine today  Follow up in 1 month

## 2015-08-07 ENCOUNTER — Encounter: Payer: Self-pay | Admitting: Internal Medicine

## 2015-08-07 DIAGNOSIS — R0600 Dyspnea, unspecified: Secondary | ICD-10-CM | POA: Insufficient documentation

## 2015-08-07 NOTE — Assessment & Plan Note (Signed)
Sugars not controlled following with Dr. Loanne Drilling Will check A1c since he is getting blood work done Will let Dr. Loanne Drilling manage

## 2015-08-07 NOTE — Assessment & Plan Note (Signed)
Likely related to smoking, history of asthma Stressed smoking cessation Continue to work on weight loss Albuterol inhaler as needed

## 2015-08-07 NOTE — Assessment & Plan Note (Signed)
Check lipid panel Taking crestor 20 mg  Continue exercise - increase if possible Continue to work on weight loss

## 2015-08-07 NOTE — Assessment & Plan Note (Addendum)
BP not controlled Continue amlodipine 5 mg, lisinopril 40 mg Start metoprolol 25 mg daily Stressed low sodium diet Continue to work on weight loss Increase exercise Script for BP cuff so he can monitor at home

## 2015-08-23 ENCOUNTER — Ambulatory Visit (AMBULATORY_SURGERY_CENTER): Payer: Self-pay | Admitting: *Deleted

## 2015-08-23 VITALS — Ht 72.0 in | Wt 267.0 lb

## 2015-08-23 DIAGNOSIS — Z1211 Encounter for screening for malignant neoplasm of colon: Secondary | ICD-10-CM

## 2015-08-23 MED ORDER — NA SULFATE-K SULFATE-MG SULF 17.5-3.13-1.6 GM/177ML PO SOLN: ORAL | Status: DC

## 2015-08-23 NOTE — Progress Notes (Signed)
Patient denies any allergies to eggs or soy. Patient denies any problems with anesthesia/sedation. Patient denies any oxygen use at home and does not take any diet/weight loss medications. Patient declined EMMI education. Explained to patient our care-partner policy. He was upset about having to have someone stay here. Gave patient appointment mate information, he gave it back to me. He states he will call us back if he cannot find a ride.

## 2015-09-02 ENCOUNTER — Ambulatory Visit: Payer: Medicare Other | Admitting: Internal Medicine

## 2015-09-06 ENCOUNTER — Ambulatory Visit (AMBULATORY_SURGERY_CENTER): Payer: Medicare Other | Admitting: Internal Medicine

## 2015-09-06 ENCOUNTER — Encounter: Payer: Self-pay | Admitting: Internal Medicine

## 2015-09-06 VITALS — BP 148/94 | HR 78 | Temp 97.8°F | Resp 13 | Ht 72.0 in | Wt 267.0 lb

## 2015-09-06 DIAGNOSIS — D123 Benign neoplasm of transverse colon: Secondary | ICD-10-CM | POA: Diagnosis not present

## 2015-09-06 DIAGNOSIS — Z1211 Encounter for screening for malignant neoplasm of colon: Secondary | ICD-10-CM

## 2015-09-06 DIAGNOSIS — D122 Benign neoplasm of ascending colon: Secondary | ICD-10-CM | POA: Diagnosis not present

## 2015-09-06 DIAGNOSIS — Z800 Family history of malignant neoplasm of digestive organs: Secondary | ICD-10-CM | POA: Diagnosis not present

## 2015-09-06 LAB — GLUCOSE, CAPILLARY
Glucose-Capillary: 336 mg/dL — ABNORMAL HIGH (ref 65–99)
Glucose-Capillary: 296 mg/dL — ABNORMAL HIGH (ref 65–99)

## 2015-09-06 MED ORDER — SODIUM CHLORIDE 0.9 % IV SOLN: 500.0000 mL | INTRAVENOUS | Status: DC

## 2015-09-06 NOTE — Patient Instructions (Signed)
YOU HAD AN ENDOSCOPIC PROCEDURE TODAY AT [facility]:   Refer to the procedure report that was given to you for any specific questions about what was found during the examination.  If the procedure report does not answer your questions, please call your gastroenterologist to clarify.  If you requested that your care partner not be given the details of your procedure findings, then the procedure report has been included in a sealed envelope for you to review at your convenience later.  YOU SHOULD EXPECT: Some feelings of bloating in the abdomen. Passage of more gas than usual.  Walking can help get rid of the air that was put into your GI tract during the procedure and reduce the bloating. If you had a lower endoscopy (such as a colonoscopy or flexible sigmoidoscopy) you may notice spotting of blood in your stool or on the toilet paper. If you underwent a bowel prep for your procedure, you may not have a normal bowel movement for a few days.  Please Note:  You might notice some irritation and congestion in your nose or some drainage.  This is from the oxygen used during your procedure.  There is no need for concern and it should clear up in a day or so.  SYMPTOMS TO REPORT IMMEDIATELY:   Following lower endoscopy (colonoscopy or flexible sigmoidoscopy):  Excessive amounts of blood in the stool  Significant tenderness or worsening of abdominal pains  Swelling of the abdomen that is new, acute  Fever of 100F or higher   For urgent or emergent issues, a gastroenterologist can be reached at any hour by calling [phone].   DIET: Your first meal following the procedure should be a small meal and then it is ok to progress to your normal diet. Heavy or fried foods are harder to digest and may make you feel nauseous or bloated.  Likewise, meals heavy in dairy and vegetables can increase bloating.  Drink plenty of fluids but you should avoid alcoholic beverages for 24  hours.  ACTIVITY:  You should plan to take it easy for the rest of today and you should NOT DRIVE or use heavy machinery until tomorrow (because of the sedation medicines used during the test).    FOLLOW UP: Our staff will call the number listed on your records the next business day following your procedure to check on you and address any questions or concerns that you may have regarding the information given to you following your procedure. If we do not reach you, we will leave a message.  However, if you are feeling well and you are not experiencing any problems, there is no need to return our call.  We will assume that you have returned to your regular daily activities without incident.  If any biopsies were taken you will be contacted by phone or by letter within the next 1-3 weeks.  Please call us at [phone] if you have not heard about the biopsies in 3 weeks.    SIGNATURES/CONFIDENTIALITY: You and/or your care partner have signed paperwork which will be entered into your electronic medical record.  These signatures attest to the fact that that the information above on your After Visit Summary has been reviewed and is understood.  Full responsibility of the confidentiality of this discharge information lies with you and/or your care-partner.  Polyps, hemorrhoids-handouts given  Repeat colonoscopy will be determined by pathology.

## 2015-09-06 NOTE — Progress Notes (Signed)
A/ox3, pleased with MAC, report to RN 

## 2015-09-06 NOTE — Progress Notes (Signed)
Called to room to assist during endoscopic procedure.  Patient ID and intended procedure confirmed with present staff. Received instructions for my participation in the procedure from the performing physician.  

## 2015-09-06 NOTE — Op Note (Addendum)
Anthony Howell Patient Name: Sushanth Digangi Procedure Date: 09/06/2015 9:23 AM MRN: [id number] Endoscopist: Jerene Bears , MD Age: 55 Referring MD:  Date of Birth: 02-26-1961 Gender: Male Procedure:                Colonoscopy Indications:              Colon cancer screening in patient at increased                            risk: Family history of 1st-degree relative with                            colon polyps, Last colonoscopy 10 years ago Medicines:                Monitored Anesthesia Care Procedure:                Pre-Anesthesia Assessment:                           - Prior to the procedure, a History and Physical                            was performed, and patient medications and                            allergies were reviewed. The patient's tolerance of                            previous anesthesia was also reviewed. The risks                            and benefits of the procedure and the sedation                            options and risks were discussed with the patient.                            All questions were answered, and informed consent                            was obtained. Prior Anticoagulants: The patient has                            taken no previous anticoagulant or antiplatelet                            agents. ASA Grade Assessment: III - A patient with                            severe systemic disease. After reviewing the risks                            and benefits, the patient was deemed in  satisfactory condition to undergo the procedure.                           After obtaining informed consent, the colonoscope                            was passed under direct vision. Throughout the                            procedure, the patient's blood pressure, pulse, and                            oxygen saturations were monitored continuously. The                            Model CF-HQ190L [id number]) scope was  introduced                            through the anus and advanced to the the cecum,                            identified by appendiceal orifice and ileocecal                            valve. The colonoscopy was somewhat difficult due                            to the patient's body habitus. Successful                            completion of the procedure was aided by using                            manual pressure. The patient tolerated the                            procedure well. The quality of the bowel                            preparation was good. The ileocecal valve,                            appendiceal orifice, and rectum were photographed. Scope In: 9:31:36 AM Scope Out: 10:01:02 AM Scope Withdrawal Time: 0 hours 24 minutes 5 seconds  Total Procedure Duration: 0 hours 29 minutes 26 seconds  Findings:      The digital rectal exam was normal.      Two sessile polyps were found in the transverse colon and ascending       colon. The polyps were 4 to 6 mm in size. These polyps were removed with       a cold snare. Resection and retrieval were complete.      The exam was otherwise normal throughout the examined colon.      Internal hemorrhoids were found during retroflexion. The hemorrhoids  were medium-sized. Complications:            No immediate complications. Estimated Blood Loss:     Estimated blood loss was minimal. Impression:               - Two 4 to 6 mm polyps in the transverse colon and                            in the ascending colon, removed with a cold snare.                            Resected and retrieved.                           - Internal hemorrhoids. Recommendation:           - Patient has a contact number available for                            emergencies. The signs and symptoms of potential                            delayed complications were discussed with the                            patient. Return to normal activities tomorrow.                             Written discharge instructions were provided to the                            patient.                           - Resume previous diet.                           - Continue present medications.                           - Await pathology results.                           - Repeat colonoscopy is recommended for                            surveillance. The colonoscopy date will be                            determined after pathology results from today's                            exam become available for review. Procedure Code(s):        --- Professional ---                           [phone], Colonoscopy, flexible; with removal of  tumor(s), polyp(s), or other lesion(s) by snare                            technique CPT copyright 2016 American Medical Association. All rights reserved. Lajuan Lines. Hilarie Fredrickson, MD Jerene Bears, MD 09/06/2015 10:07:01 AM This report has been signed electronically. Number of Addenda: 1 Addendum Number: 1   Addendum Date: 09/06/2015 10:07:39 AM      Last colonoscopy performed at age 30 for screening in [place], [place]. Pt       reports normal at that time. Lajuan Lines. Hilarie Fredrickson, MD Jerene Bears, MD 09/06/2015 10:08:07 AM This report has been signed electronically.

## 2015-09-07 ENCOUNTER — Telehealth: Payer: Self-pay | Admitting: *Deleted

## 2015-09-07 NOTE — Telephone Encounter (Signed)
  Follow up Call-  Call back number 09/06/2015  Post procedure Call Back phone  # [phone]  Permission to leave phone message Yes     Patient questions:  Do you have a fever, pain , or abdominal swelling? No. Pain Score  0 *  Have you tolerated food without any problems? Yes.    Have you been able to return to your normal activities? Yes.    Do you have any questions about your discharge instructions: Diet   No. Medications  No. Follow up visit  No.  Do you have questions or concerns about your Care? No.  Actions: * If pain score is 4 or above: No action needed, pain <4.

## 2015-09-14 ENCOUNTER — Encounter: Payer: Self-pay | Admitting: Internal Medicine

## 2015-09-29 ENCOUNTER — Ambulatory Visit: Payer: Medicare Other | Admitting: Internal Medicine

## 2015-10-01 ENCOUNTER — Telehealth: Payer: Self-pay | Admitting: Internal Medicine

## 2015-10-01 MED ORDER — ATORVASTATIN CALCIUM 40 MG PO TABS: 40.0000 mg | ORAL_TABLET | Freq: Every day | ORAL | Status: DC

## 2015-10-01 NOTE — Telephone Encounter (Signed)
Please advise 

## 2015-10-01 NOTE — Telephone Encounter (Signed)
Spoke with pt to inform.  

## 2015-10-01 NOTE — Telephone Encounter (Signed)
Pt called request to change from Lovastatin to Atorvastatin ( it will be cheaper). Please send in to [facility], pt is out of this med.

## 2015-10-01 NOTE — Telephone Encounter (Signed)
I think it is from crestor to lipitor --- rx sent to pharmacy

## 2015-10-06 ENCOUNTER — Telehealth: Payer: Self-pay | Admitting: Emergency Medicine

## 2015-10-06 NOTE — Telephone Encounter (Signed)
Received Fax from Pts pharmacy for refill on Crestor. Not on current med list. Please advise.

## 2015-10-06 NOTE — Telephone Encounter (Signed)
He recently requested changing from crestor to lipitor due to expense.  Do not fill crestor

## 2015-10-08 ENCOUNTER — Other Ambulatory Visit: Payer: Self-pay | Admitting: Internal Medicine

## 2015-11-16 DIAGNOSIS — G894 Chronic pain syndrome: Secondary | ICD-10-CM | POA: Diagnosis not present

## 2015-11-16 DIAGNOSIS — G4700 Insomnia, unspecified: Secondary | ICD-10-CM | POA: Diagnosis not present

## 2015-11-16 DIAGNOSIS — M961 Postlaminectomy syndrome, not elsewhere classified: Secondary | ICD-10-CM | POA: Diagnosis not present

## 2015-11-16 DIAGNOSIS — M47817 Spondylosis without myelopathy or radiculopathy, lumbosacral region: Secondary | ICD-10-CM | POA: Diagnosis not present

## 2015-12-07 ENCOUNTER — Other Ambulatory Visit: Payer: Self-pay | Admitting: Internal Medicine

## 2015-12-23 ENCOUNTER — Other Ambulatory Visit: Payer: Self-pay | Admitting: Internal Medicine

## 2016-01-13 DIAGNOSIS — M47817 Spondylosis without myelopathy or radiculopathy, lumbosacral region: Secondary | ICD-10-CM | POA: Diagnosis not present

## 2016-01-13 DIAGNOSIS — G4700 Insomnia, unspecified: Secondary | ICD-10-CM | POA: Diagnosis not present

## 2016-01-13 DIAGNOSIS — G894 Chronic pain syndrome: Secondary | ICD-10-CM | POA: Diagnosis not present

## 2016-01-13 DIAGNOSIS — M961 Postlaminectomy syndrome, not elsewhere classified: Secondary | ICD-10-CM | POA: Diagnosis not present

## 2016-02-02 ENCOUNTER — Other Ambulatory Visit: Payer: Self-pay | Admitting: Internal Medicine

## 2016-03-16 DIAGNOSIS — Z79891 Long term (current) use of opiate analgesic: Secondary | ICD-10-CM | POA: Diagnosis not present

## 2016-03-16 DIAGNOSIS — G4700 Insomnia, unspecified: Secondary | ICD-10-CM | POA: Diagnosis not present

## 2016-03-16 DIAGNOSIS — M47817 Spondylosis without myelopathy or radiculopathy, lumbosacral region: Secondary | ICD-10-CM | POA: Diagnosis not present

## 2016-03-16 DIAGNOSIS — G894 Chronic pain syndrome: Secondary | ICD-10-CM | POA: Diagnosis not present

## 2016-03-16 DIAGNOSIS — M961 Postlaminectomy syndrome, not elsewhere classified: Secondary | ICD-10-CM | POA: Diagnosis not present

## 2016-03-28 ENCOUNTER — Other Ambulatory Visit: Payer: Self-pay | Admitting: Internal Medicine

## 2016-03-28 ENCOUNTER — Other Ambulatory Visit: Payer: Self-pay | Admitting: Endocrinology

## 2016-03-28 DIAGNOSIS — E1140 Type 2 diabetes mellitus with diabetic neuropathy, unspecified: Secondary | ICD-10-CM

## 2016-04-17 ENCOUNTER — Other Ambulatory Visit: Payer: Self-pay | Admitting: Endocrinology

## 2016-04-17 DIAGNOSIS — E1140 Type 2 diabetes mellitus with diabetic neuropathy, unspecified: Secondary | ICD-10-CM

## 2016-04-17 NOTE — Telephone Encounter (Signed)
Patient need refill of insulin NPH Human (HUMULIN N) 100 UNIT/ML injection  He will be out by tomorrow.   [facility] [place], [address] [phone] (Phone) [phone] (Fax)

## 2016-04-17 NOTE — Telephone Encounter (Signed)
We can't prescribe for patients who are not active here.

## 2016-04-17 NOTE — Telephone Encounter (Signed)
Please advise of previous note from Anthony Howell, patient needing Humulin N, okay to refill?? Please advise. Thank you!

## 2016-04-17 NOTE — Telephone Encounter (Signed)
Pt called again about his insulin refill and said that he knows he has not been seen and cannot come to his office visits because he cannot afford $50 copay every time.  I informed him that we could just bill him for the copay and Dr. Loanne Drilling would still see him, he stated that "it doesn't matter bill me or not I still can't afford it."  I told him that I would let Dr. Loanne Drilling know this and see if he can send you in something.  He also stated that he doesn't understand why he even needs a prescription because you can just buy insulin at [facility].  He said that if his insulin does not get sent to his pharmacy within the next hour and a half he just "won't have it."  Almyra Free, could you please advise in Megan's absence?

## 2016-04-19 ENCOUNTER — Other Ambulatory Visit: Payer: Self-pay | Admitting: Internal Medicine

## 2016-04-25 ENCOUNTER — Other Ambulatory Visit: Payer: Self-pay | Admitting: Internal Medicine

## 2016-05-11 DIAGNOSIS — G894 Chronic pain syndrome: Secondary | ICD-10-CM | POA: Diagnosis not present

## 2016-05-11 DIAGNOSIS — G4700 Insomnia, unspecified: Secondary | ICD-10-CM | POA: Diagnosis not present

## 2016-05-11 DIAGNOSIS — Z79891 Long term (current) use of opiate analgesic: Secondary | ICD-10-CM | POA: Diagnosis not present

## 2016-05-11 DIAGNOSIS — M961 Postlaminectomy syndrome, not elsewhere classified: Secondary | ICD-10-CM | POA: Diagnosis not present

## 2016-05-11 DIAGNOSIS — M47817 Spondylosis without myelopathy or radiculopathy, lumbosacral region: Secondary | ICD-10-CM | POA: Diagnosis not present

## 2016-05-17 ENCOUNTER — Other Ambulatory Visit: Payer: Self-pay | Admitting: Internal Medicine

## 2016-06-16 ENCOUNTER — Other Ambulatory Visit: Payer: Self-pay | Admitting: Internal Medicine

## 2016-06-22 ENCOUNTER — Other Ambulatory Visit: Payer: Self-pay | Admitting: Internal Medicine

## 2016-07-06 DIAGNOSIS — M47817 Spondylosis without myelopathy or radiculopathy, lumbosacral region: Secondary | ICD-10-CM | POA: Diagnosis not present

## 2016-07-06 DIAGNOSIS — M961 Postlaminectomy syndrome, not elsewhere classified: Secondary | ICD-10-CM | POA: Diagnosis not present

## 2016-07-06 DIAGNOSIS — G894 Chronic pain syndrome: Secondary | ICD-10-CM | POA: Diagnosis not present

## 2016-07-06 DIAGNOSIS — G4700 Insomnia, unspecified: Secondary | ICD-10-CM | POA: Diagnosis not present

## 2016-07-06 DIAGNOSIS — Z79891 Long term (current) use of opiate analgesic: Secondary | ICD-10-CM | POA: Diagnosis not present

## 2016-07-31 ENCOUNTER — Telehealth: Payer: Self-pay | Admitting: Internal Medicine

## 2016-07-31 MED ORDER — LISINOPRIL 40 MG PO TABS: 40.0000 mg | ORAL_TABLET | Freq: Every day | ORAL | 0 refills | Status: DC

## 2016-07-31 NOTE — Telephone Encounter (Signed)
Patient has appt for end of month for med follow up.  Is requesting refill on lisinopril to get him through until then.  Patient uses [facility] at [address]

## 2016-08-07 ENCOUNTER — Telehealth: Payer: Self-pay | Admitting: Internal Medicine

## 2016-08-07 NOTE — Telephone Encounter (Signed)
Called patient to schedule awv. Lvm for patient to call office to schedule appt.  °

## 2016-08-10 ENCOUNTER — Telehealth: Payer: Self-pay | Admitting: Internal Medicine

## 2016-08-10 NOTE — Telephone Encounter (Signed)
Agree, he should be evaluated in the ED

## 2016-08-10 NOTE — Telephone Encounter (Signed)
Spoke with pt to inform. He agreed to go to the ED.
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
## 2016-08-10 NOTE — Telephone Encounter (Signed)
Please advise, should pt go to ED?

## 2016-08-11 ENCOUNTER — Emergency Department (HOSPITAL_COMMUNITY): Payer: Medicare Other

## 2016-08-11 ENCOUNTER — Emergency Department (HOSPITAL_COMMUNITY)
Admission: EM | Admit: 2016-08-11 | Discharge: 2016-08-11 | Disposition: A | Discharge status: Home / Self Care | Payer: Medicare Other | Attending: Emergency Medicine | Admitting: Emergency Medicine

## 2016-08-11 ENCOUNTER — Encounter (HOSPITAL_COMMUNITY): Payer: Self-pay | Admitting: Emergency Medicine

## 2016-08-11 DIAGNOSIS — Y999 Unspecified external cause status: Secondary | ICD-10-CM | POA: Insufficient documentation

## 2016-08-11 DIAGNOSIS — Z794 Long term (current) use of insulin: Secondary | ICD-10-CM | POA: Diagnosis not present

## 2016-08-11 DIAGNOSIS — S3992XA Unspecified injury of lower back, initial encounter: Secondary | ICD-10-CM | POA: Diagnosis not present

## 2016-08-11 DIAGNOSIS — M546 Pain in thoracic spine: Secondary | ICD-10-CM | POA: Diagnosis not present

## 2016-08-11 DIAGNOSIS — F17210 Nicotine dependence, cigarettes, uncomplicated: Secondary | ICD-10-CM | POA: Insufficient documentation

## 2016-08-11 DIAGNOSIS — Z7982 Long term (current) use of aspirin: Secondary | ICD-10-CM | POA: Diagnosis not present

## 2016-08-11 DIAGNOSIS — S161XXA Strain of muscle, fascia and tendon at neck level, initial encounter: Secondary | ICD-10-CM

## 2016-08-11 DIAGNOSIS — M542 Cervicalgia: Secondary | ICD-10-CM | POA: Diagnosis not present

## 2016-08-11 DIAGNOSIS — Y939 Activity, unspecified: Secondary | ICD-10-CM | POA: Insufficient documentation

## 2016-08-11 DIAGNOSIS — E119 Type 2 diabetes mellitus without complications: Secondary | ICD-10-CM | POA: Diagnosis not present

## 2016-08-11 DIAGNOSIS — J45909 Unspecified asthma, uncomplicated: Secondary | ICD-10-CM | POA: Insufficient documentation

## 2016-08-11 DIAGNOSIS — Y92410 Unspecified street and highway as the place of occurrence of the external cause: Secondary | ICD-10-CM | POA: Diagnosis not present

## 2016-08-11 DIAGNOSIS — M5442 Lumbago with sciatica, left side: Secondary | ICD-10-CM | POA: Diagnosis not present

## 2016-08-11 DIAGNOSIS — I10 Essential (primary) hypertension: Secondary | ICD-10-CM | POA: Diagnosis not present

## 2016-08-11 DIAGNOSIS — S199XXA Unspecified injury of neck, initial encounter: Secondary | ICD-10-CM | POA: Diagnosis not present
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
## 2016-08-11 NOTE — ED Notes (Signed)
Checked on status of CT. Anthony Howell in [place] states that there are 2 ahead of pt, but contrast will start in approx 20 mins.

## 2016-08-11 NOTE — ED Notes (Signed)
Patient transported to X-ray & CT °

## 2016-08-11 NOTE — Discharge Instructions (Signed)
Please read attached information. If you experience any new or worsening signs or symptoms please return to the emergency room for evaluation. Please follow-up with your primary care provider or specialist as discussed.  °
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
## 2016-08-11 NOTE — ED Notes (Signed)
EDP at bedside  

## 2016-08-11 NOTE — ED Notes (Signed)
Pt walking around unit states he cannot sit for long due to his sciatic nerve pain. Sandwich was provided. Pt encouraged to stay for the last CT scan. Pt went back in room.

## 2016-08-11 NOTE — ED Notes (Signed)
Patient transported to CT 
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
## 2016-08-16 ENCOUNTER — Telehealth: Payer: Self-pay | Admitting: Internal Medicine

## 2016-08-16 NOTE — Telephone Encounter (Signed)
Pt called in and has another important appt on the 28th.  I had to move his appt to march 12th.  Can you fill his bp meds till the 12th ?

## 2016-08-17 MED ORDER — AMLODIPINE BESYLATE 5 MG PO TABS: 5.0000 mg | ORAL_TABLET | Freq: Every day | ORAL | 0 refills | Status: DC

## 2016-08-17 MED ORDER — METOPROLOL SUCCINATE ER 25 MG PO TB24: 25.0000 mg | ORAL_TABLET | Freq: Every day | ORAL | 0 refills | Status: DC

## 2016-08-17 MED ORDER — LISINOPRIL 40 MG PO TABS: 40.0000 mg | ORAL_TABLET | Freq: Every day | ORAL | 0 refills | Status: DC

## 2016-08-17 NOTE — Telephone Encounter (Signed)
Meds have been sent to [facility]

## 2016-08-23 ENCOUNTER — Ambulatory Visit: Payer: Medicare Other | Admitting: Internal Medicine

## 2016-08-23 DIAGNOSIS — G4700 Insomnia, unspecified: Secondary | ICD-10-CM | POA: Diagnosis not present

## 2016-08-23 DIAGNOSIS — M961 Postlaminectomy syndrome, not elsewhere classified: Secondary | ICD-10-CM | POA: Diagnosis not present

## 2016-08-23 DIAGNOSIS — G894 Chronic pain syndrome: Secondary | ICD-10-CM | POA: Diagnosis not present

## 2016-08-23 DIAGNOSIS — M47817 Spondylosis without myelopathy or radiculopathy, lumbosacral region: Secondary | ICD-10-CM | POA: Diagnosis not present

## 2016-08-31 DIAGNOSIS — G894 Chronic pain syndrome: Secondary | ICD-10-CM | POA: Diagnosis not present

## 2016-08-31 DIAGNOSIS — M47817 Spondylosis without myelopathy or radiculopathy, lumbosacral region: Secondary | ICD-10-CM | POA: Diagnosis not present

## 2016-08-31 DIAGNOSIS — Z79891 Long term (current) use of opiate analgesic: Secondary | ICD-10-CM | POA: Diagnosis not present

## 2016-08-31 DIAGNOSIS — G4700 Insomnia, unspecified: Secondary | ICD-10-CM | POA: Diagnosis not present

## 2016-08-31 DIAGNOSIS — M961 Postlaminectomy syndrome, not elsewhere classified: Secondary | ICD-10-CM | POA: Diagnosis not present

## 2016-09-03 NOTE — Progress Notes (Deleted)
Subjective:    Patient ID: Anthony Howell, male    DOB: 11-Sep-1960, 56 y.o.   MRN: [id number]  HPI He is here for follow up.  Diabetes: He is taking his medication daily as prescribed. He is compliant with a diabetic diet. He is exercising regularly. He monitors his sugars and they have been running XXX. He checks his feet daily and denies foot lesions. He is up-to-date with an ophthalmology examination.   Hypertension: He is taking his medication daily. He is compliant with a low sodium diet.  He denies chest pain, palpitations, edema, shortness of breath and regular headaches. He is exercising regularly.  He does not monitor his blood pressure at home.    Hyperlipidemia: He is taking his medication daily. He is compliant with a low fat/cholesterol diet. He is exercising regularly. He denies myalgias.   chronic pain syndrome:  Asthma:  Medications and allergies reviewed with patient and updated if appropriate.  Patient Active Problem List   Diagnosis Date Noted  . Dyspnea 08/07/2015  . Lumbar radiculopathy, chronic, left 07/01/2015  . Diabetes ([place]) 05/12/2013  . Smoker 05/12/2013  . Sleep apnea 02/20/2012  . Chronic pain syndrome 02/20/2012  . ANXIETY STATE, UNSPECIFIED 05/18/2009  . MOOD SWINGS 02/18/2009  . ASTHMA 01/22/2008  . History of DVT of lower extremity, left 09/12/2007  . Hyperlipidemia 12/07/2006  . Essential hypertension 12/07/2006  . PSORIASIS NEC 12/07/2006  . ADVEF, DRUG/MED/BIOL SUBST, ARTHUS PHENOMENON 11/28/2006    Current Outpatient Prescriptions on File Prior to Visit  Medication Sig Dispense Refill  . amLODipine (NORVASC) 5 MG tablet Take 1 tablet (5 mg total) by mouth daily. 90 tablet 0  . aspirin EC 325 MG tablet Take 325 mg by mouth daily.    Marland Kitchen atorvastatin (LIPITOR) 40 MG tablet Take 1 tablet (40 mg total) by mouth daily. 90 tablet 3  . Blood Pressure Monitoring (B-D ASSURE BPM/AUTO ARM CUFF) MISC Use as directed to check blood pressure daily.   Diagnosis: uncontrolled BP 1 each 0  . DULoxetine (CYMBALTA) 60 MG capsule Take 60 mg by mouth daily.    Marland Kitchen glucose blood (ONETOUCH VERIO) test strip 1 each by Other route 2 (two) times daily. And lancets 2/day 100 each 12  . insulin NPH Human (HUMULIN N) 100 UNIT/ML injection Inject 0.3 mLs (30 Units total) into the skin every morning. And 100-unit syringes 1/day 10 mL 11  . Insulin Syringe-Needle U-100 (INSULIN SYRINGE 1CC/30GX5/16") 30G X 5/16" 1 ML MISC Use to inject insulin 1 timer per day. 100 each 2  . lisinopril (PRINIVIL,ZESTRIL) 40 MG tablet Take 1 tablet (40 mg total) by mouth daily. 90 tablet 0  . metoprolol succinate (TOPROL-XL) 25 MG 24 hr tablet Take 1 tablet (25 mg total) by mouth daily. 90 tablet 0  . ONETOUCH DELICA LANCETS FINE MISC Use to check blood sugar 2 times per day 200 each 2  . OxyCODONE (OXYCONTIN) 20 mg T12A 12 hr tablet Take 20 mg by mouth 3 (three) times daily.    Marland Kitchen PROAIR HFA 108 (90 Base) MCG/ACT inhaler INHALE 1 TO 2 PUFFS INTO THE LUNGS EVERY 4 TO 6 HOURS AS NEEDED 25.5 g 1   No current facility-administered medications on file prior to visit.     Past Medical History:  Diagnosis Date  . Arthritis    right shoulder and knee  . Asthma   . Diabetes mellitus    AODM  . DVT (deep venous thrombosis) ([place])  peri operatively @ back surgery  . Hyperlipidemia   . Hypertension   . Psoriasis     Past Surgical History:  Procedure Laterality Date  . micrdisectomy     complicated by DVT  . TONSILLECTOMY      Social History   Social History  . Marital status: Single    Spouse name: N/A  . Number of children: N/A  . Years of education: N/A   Social History Main Topics  . Smoking status: Current Every Day Smoker    Packs/day: 0.50    Types: Cigarettes  . Smokeless tobacco: Never Used  . Alcohol use 0.0 oz/week     Comment:  very rarely  . Drug use: Yes    Types: Marijuana     Comment: smoke daily  . Sexual activity: Not on file   Other  Topics Concern  . Not on file   Social History Narrative   Exercise: walks dog    Family History  Problem Relation Age of Onset  . Heart disease Mother     MI; stents and on coumadin  . Aneurysm Mother     of brain 4/12  . Dementia Mother   . Peripheral vascular disease Mother     clot to leg, S/P removal  . Colon polyps Mother   . Heart disease Father     MI  . Cancer Father     PANCREATIC; died May 21, 2023  . Bipolar disorder Brother   . Other Brother     valve replacement  . Epilepsy Brother   . Colon cancer Neg Hx     Review of Systems     Objective:  There were no vitals filed for this visit. There were no vitals filed for this visit. There is no height or weight on file to calculate BMI.  Wt Readings from Last 3 Encounters:  09/06/15 267 lb (121.1 kg)  08/23/15 267 lb (121.1 kg)  08/05/15 269 lb (122 kg)     Physical Exam Constitutional: Appears well-developed and well-nourished. No distress.  HENT:  Head: Normocephalic and atraumatic.  Neck: Neck supple. No tracheal deviation present. No thyromegaly present.  No cervical lymphadenopathy Cardiovascular: Normal rate, regular rhythm and normal heart sounds.   No murmur heard. No carotid bruit .  No edema Pulmonary/Chest: Effort normal and breath sounds normal. No respiratory distress. No has no wheezes. No rales.  Skin: Skin is warm and dry. Not diaphoretic.  Psychiatric: Normal mood and affect. Behavior is normal.         Assessment & Plan:   See Problem List for Assessment and Plan of chronic medical problems.

## 2016-09-04 ENCOUNTER — Ambulatory Visit: Payer: Medicare Other | Admitting: Internal Medicine

## 2016-09-05 DIAGNOSIS — I10 Essential (primary) hypertension: Secondary | ICD-10-CM | POA: Diagnosis not present

## 2016-09-05 DIAGNOSIS — M5416 Radiculopathy, lumbar region: Secondary | ICD-10-CM | POA: Diagnosis not present

## 2016-09-07 ENCOUNTER — Other Ambulatory Visit: Payer: Self-pay | Admitting: Student

## 2016-09-07 DIAGNOSIS — M5416 Radiculopathy, lumbar region: Secondary | ICD-10-CM

## 2016-09-15 ENCOUNTER — Ambulatory Visit: Payer: Medicare Other | Admitting: Internal Medicine

## 2016-09-20 ENCOUNTER — Ambulatory Visit
Admission: RE | Admit: 2016-09-20 | Discharge: 2016-09-20 | Disposition: A | Discharge status: Home / Self Care | Payer: Medicare Other | Source: Ambulatory Visit | Attending: Student | Admitting: Student

## 2016-09-20 DIAGNOSIS — M48061 Spinal stenosis, lumbar region without neurogenic claudication: Secondary | ICD-10-CM | POA: Diagnosis not present

## 2016-09-20 DIAGNOSIS — M5416 Radiculopathy, lumbar region: Secondary | ICD-10-CM

## 2016-09-20 MED ORDER — GADOBENATE DIMEGLUMINE 529 MG/ML IV SOLN
20.0000 mL | Freq: Once | INTRAVENOUS | Status: AC | PRN
  Administered 2016-09-20: 20 mL via INTRAVENOUS

## 2016-09-27 DIAGNOSIS — G4700 Insomnia, unspecified: Secondary | ICD-10-CM | POA: Diagnosis not present

## 2016-09-27 DIAGNOSIS — M47817 Spondylosis without myelopathy or radiculopathy, lumbosacral region: Secondary | ICD-10-CM | POA: Diagnosis not present

## 2016-09-27 DIAGNOSIS — M961 Postlaminectomy syndrome, not elsewhere classified: Secondary | ICD-10-CM | POA: Diagnosis not present

## 2016-09-27 DIAGNOSIS — G894 Chronic pain syndrome: Secondary | ICD-10-CM | POA: Diagnosis not present
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
## 2016-09-29 ENCOUNTER — Ambulatory Visit (INDEPENDENT_AMBULATORY_CARE_PROVIDER_SITE_OTHER): Payer: Medicare Other | Admitting: Internal Medicine

## 2016-09-29 ENCOUNTER — Other Ambulatory Visit (INDEPENDENT_AMBULATORY_CARE_PROVIDER_SITE_OTHER): Payer: Medicare Other

## 2016-09-29 ENCOUNTER — Encounter: Payer: Self-pay | Admitting: Internal Medicine

## 2016-09-29 VITALS — BP 138/94 | HR 97 | Temp 97.7°F | Resp 16 | Wt 243.0 lb

## 2016-09-29 DIAGNOSIS — Z794 Long term (current) use of insulin: Secondary | ICD-10-CM | POA: Diagnosis not present

## 2016-09-29 DIAGNOSIS — I10 Essential (primary) hypertension: Secondary | ICD-10-CM

## 2016-09-29 DIAGNOSIS — Z125 Encounter for screening for malignant neoplasm of prostate: Secondary | ICD-10-CM | POA: Diagnosis not present

## 2016-09-29 DIAGNOSIS — E1140 Type 2 diabetes mellitus with diabetic neuropathy, unspecified: Secondary | ICD-10-CM | POA: Diagnosis not present

## 2016-09-29 DIAGNOSIS — E7800 Pure hypercholesterolemia, unspecified: Secondary | ICD-10-CM

## 2016-09-29 LAB — COMPREHENSIVE METABOLIC PANEL
ALBUMIN: 4.2 g/dL (ref 3.5–5.2)
ALT: 9 U/L (ref 0–53)
AST: 12 U/L (ref 0–37)
Alkaline Phosphatase: 86 U/L (ref 39–117)
BILIRUBIN TOTAL: 0.4 mg/dL (ref 0.2–1.2)
BUN: 14 mg/dL (ref 6–23)
CALCIUM: 9.5 mg/dL (ref 8.4–10.5)
CO2: 29 mEq/L (ref 19–32)
CREATININE: 0.63 mg/dL (ref 0.40–1.50)
Chloride: 94 mEq/L — ABNORMAL LOW (ref 96–112)
GFR: 140.35 mL/min (ref >60.00)
Glucose, Bld: 432 mg/dL — ABNORMAL HIGH (ref 70–99)
Potassium: 5.5 mEq/L — ABNORMAL HIGH (ref 3.5–5.1)
Sodium: 131 mEq/L — ABNORMAL LOW (ref 135–145)
Total Protein: 6.9 g/dL (ref 6.0–8.3)

## 2016-09-29 LAB — CBC WITH DIFFERENTIAL/PLATELET
BASOS ABS: 0.1 10*3/uL (ref 0.0–0.1)
Basophils Relative: 0.9 % (ref 0.0–3.0)
EOS ABS: 0.2 10*3/uL (ref 0.0–0.7)
Eosinophils Relative: 3 % (ref 0.0–5.0)
HEMATOCRIT: 51.3 % (ref 39.0–52.0)
HEMOGLOBIN: 17.6 g/dL — AB (ref 13.0–17.0)
LYMPHS PCT: 25.8 % (ref 12.0–46.0)
Lymphs Abs: 2 10*3/uL (ref 0.7–4.0)
MCHC: 34.2 g/dL (ref 30.0–36.0)
MCV: 90.5 fl (ref 78.0–100.0)
Monocytes Absolute: 0.8 10*3/uL (ref 0.1–1.0)
Monocytes Relative: 9.7 % (ref 3.0–12.0)
NEUTROS ABS: 4.7 10*3/uL (ref 1.4–7.7)
Neutrophils Relative %: 60.6 % (ref 43.0–77.0)
PLATELETS: 267 10*3/uL (ref 150.0–400.0)
RBC: 5.67 Mil/uL (ref 4.22–5.81)
RDW: 12.8 % (ref 11.5–15.5)
WBC: 7.8 10*3/uL (ref 4.0–10.5)

## 2016-09-29 LAB — PSA, MEDICARE: PSA: 0.63 ng/ml (ref 0.10–4.00)

## 2016-09-29 LAB — LIPID PANEL
Cholesterol: 219 mg/dL — ABNORMAL HIGH (ref 0–200)
HDL: 40.1 mg/dL (ref >39.00)
Total CHOL/HDL Ratio: 5
Triglycerides: 402 mg/dL — ABNORMAL HIGH (ref 0.0–149.0)

## 2016-09-29 LAB — HEMOGLOBIN A1C: Hgb A1c MFr Bld: 15.6 % — ABNORMAL HIGH (ref 4.6–6.5)

## 2016-09-29 LAB — LDL CHOLESTEROL, DIRECT: LDL DIRECT: 117 mg/dL

## 2016-09-29 LAB — TSH: TSH: 1.7 u[IU]/mL (ref 0.35–4.50)

## 2016-09-29 MED ORDER — INSULIN NPH (HUMAN) (ISOPHANE) 100 UNIT/ML ~~LOC~~ SUSP: 50.0000 [IU] | SUBCUTANEOUS | 11 refills | Status: AC

## 2016-09-29 MED ORDER — ROSUVASTATIN CALCIUM 20 MG PO TABS: 20.0000 mg | ORAL_TABLET | Freq: Every day | ORAL | 3 refills | Status: AC

## 2016-09-29 MED ORDER — METOPROLOL SUCCINATE ER 25 MG PO TB24: 25.0000 mg | ORAL_TABLET | Freq: Every day | ORAL | 1 refills | Status: AC

## 2016-09-29 MED ORDER — AMLODIPINE BESYLATE 5 MG PO TABS: 5.0000 mg | ORAL_TABLET | Freq: Every day | ORAL | 1 refills | Status: AC

## 2016-09-29 NOTE — Patient Instructions (Signed)
  Test(s) ordered today. Your results will be released to MyChart (or called to you) after review, usually within 72hours after test completion. If any changes need to be made, you will be notified at that same time.  Medications reviewed and updated.  No changes recommended at this time.  Your prescription(s) have been submitted to your pharmacy. Please take as directed and contact our office if you believe you are having problem(s) with the medication(s).    Please followup in 3 months   

## 2016-09-29 NOTE — Assessment & Plan Note (Addendum)
Not taking his statin -  Restart it today Check lipids Eating more healthy, walking for exercise, but has not done it recently -  Restart walking Continue weight loss efforts

## 2016-09-29 NOTE — Progress Notes (Signed)
Pre visit review using our clinic review tool, if applicable. No additional management support is needed unless otherwise documented below in the visit note. 

## 2016-09-29 NOTE — Assessment & Plan Note (Signed)
Check a1c Low sugar / carb diet Stressed regular exercise,  weight loss Continue insulin for now - he would rather be on victoza - did well on that previously - can consider - will see what a1c is Should start checking sugars

## 2016-09-29 NOTE — Assessment & Plan Note (Signed)
Slightly elevated here today, better at home Continue current medication Work on weight loss Get back to walking Will adjust meds at next visit if needed

## 2016-09-30 ENCOUNTER — Other Ambulatory Visit: Payer: Self-pay | Admitting: Internal Medicine

## 2016-09-30 ENCOUNTER — Encounter: Payer: Self-pay | Admitting: Internal Medicine

## 2016-09-30 DIAGNOSIS — E875 Hyperkalemia: Secondary | ICD-10-CM

## 2016-10-09 DIAGNOSIS — M5126 Other intervertebral disc displacement, lumbar region: Secondary | ICD-10-CM | POA: Diagnosis not present

## 2016-10-09 DIAGNOSIS — I10 Essential (primary) hypertension: Secondary | ICD-10-CM | POA: Diagnosis not present

## 2016-10-12 ENCOUNTER — Other Ambulatory Visit (INDEPENDENT_AMBULATORY_CARE_PROVIDER_SITE_OTHER): Payer: Medicare Other

## 2016-10-12 DIAGNOSIS — E875 Hyperkalemia: Secondary | ICD-10-CM | POA: Diagnosis not present

## 2016-10-12 LAB — BASIC METABOLIC PANEL
BUN: 12 mg/dL (ref 6–23)
CALCIUM: 9.3 mg/dL (ref 8.4–10.5)
CO2: 30 meq/L (ref 19–32)
Chloride: 94 mEq/L — ABNORMAL LOW (ref 96–112)
Creatinine, Ser: 0.66 mg/dL (ref 0.40–1.50)
GFR: 132.99 mL/min (ref >60.00)
Glucose, Bld: 436 mg/dL — ABNORMAL HIGH (ref 70–99)
Potassium: 4.7 mEq/L (ref 3.5–5.1)
SODIUM: 129 meq/L — AB (ref 135–145)

## 2016-10-14 ENCOUNTER — Encounter: Payer: Self-pay | Admitting: Internal Medicine

## 2016-10-25 DIAGNOSIS — G4700 Insomnia, unspecified: Secondary | ICD-10-CM | POA: Diagnosis not present

## 2016-10-25 DIAGNOSIS — M961 Postlaminectomy syndrome, not elsewhere classified: Secondary | ICD-10-CM | POA: Diagnosis not present

## 2016-10-25 DIAGNOSIS — G894 Chronic pain syndrome: Secondary | ICD-10-CM | POA: Diagnosis not present

## 2016-10-25 DIAGNOSIS — M47817 Spondylosis without myelopathy or radiculopathy, lumbosacral region: Secondary | ICD-10-CM | POA: Diagnosis not present

## 2016-11-16 DIAGNOSIS — G4700 Insomnia, unspecified: Secondary | ICD-10-CM | POA: Diagnosis not present

## 2016-11-16 DIAGNOSIS — M961 Postlaminectomy syndrome, not elsewhere classified: Secondary | ICD-10-CM | POA: Diagnosis not present

## 2016-11-16 DIAGNOSIS — M47817 Spondylosis without myelopathy or radiculopathy, lumbosacral region: Secondary | ICD-10-CM | POA: Diagnosis not present

## 2016-11-16 DIAGNOSIS — G894 Chronic pain syndrome: Secondary | ICD-10-CM | POA: Diagnosis not present

## 2016-11-20 ENCOUNTER — Other Ambulatory Visit: Payer: Self-pay | Admitting: Internal Medicine

## 2016-11-22 DIAGNOSIS — M5417 Radiculopathy, lumbosacral region: Secondary | ICD-10-CM | POA: Diagnosis not present

## 2016-11-30 ENCOUNTER — Telehealth: Payer: Self-pay | Admitting: Internal Medicine

## 2016-11-30 NOTE — Telephone Encounter (Signed)
Called patient to schedule awv. Pt did not answer. Vm did not pick up, could not leave msg.

## 2016-12-22 ENCOUNTER — Ambulatory Visit: Payer: Medicare Other | Admitting: Internal Medicine

## 2017-01-11 DIAGNOSIS — M961 Postlaminectomy syndrome, not elsewhere classified: Secondary | ICD-10-CM | POA: Diagnosis not present

## 2017-01-11 DIAGNOSIS — G894 Chronic pain syndrome: Secondary | ICD-10-CM | POA: Diagnosis not present

## 2017-01-11 DIAGNOSIS — M47817 Spondylosis without myelopathy or radiculopathy, lumbosacral region: Secondary | ICD-10-CM | POA: Diagnosis not present

## 2017-01-11 DIAGNOSIS — G4700 Insomnia, unspecified: Secondary | ICD-10-CM | POA: Diagnosis not present

## 2017-02-20 DIAGNOSIS — G4700 Insomnia, unspecified: Secondary | ICD-10-CM | POA: Diagnosis not present

## 2017-02-20 DIAGNOSIS — G894 Chronic pain syndrome: Secondary | ICD-10-CM | POA: Diagnosis not present

## 2017-02-20 DIAGNOSIS — M961 Postlaminectomy syndrome, not elsewhere classified: Secondary | ICD-10-CM | POA: Diagnosis not present

## 2017-02-20 DIAGNOSIS — M47817 Spondylosis without myelopathy or radiculopathy, lumbosacral region: Secondary | ICD-10-CM | POA: Diagnosis not present

## 2017-03-07 ENCOUNTER — Telehealth: Payer: Self-pay | Admitting: Emergency Medicine

## 2017-03-13 ENCOUNTER — Telehealth: Payer: Self-pay

## 2017-03-13 NOTE — Telephone Encounter (Signed)
On 03/13/17 I received a death certificate from [facility] (original). The death certificate is for cremation.  The patient is a patient of Doctor Billey Gosling. The death certificate will be taken to [facility] this pm for signature.  On 2017-03-22 I received the death certificate back from Doctor Billey Gosling. I got the death certificate ready and called the funeral home to let them know the death certificate is ready for pickup. I also faxed a copy to the funeral home per the funeral home request.

## 2017-03-26 DIAGNOSIS — 419620001 Death: Secondary | SNOMED CT | POA: Diagnosis not present

## 2017-03-26 NOTE — Telephone Encounter (Signed)
Received call from [facility] asking if Pts PCP would sign death certificate. Advised we would signed death certificate.

## 2017-03-26 NOTE — Telephone Encounter (Signed)
noted 

## 2017-03-26 DEATH — deceased
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
